# Patient Record
Sex: Female | Born: 1990 | Race: White | Hispanic: No | Smoking: Never smoker
Health system: Southern US, Community
[De-identification: ages and names within clinical notes are randomized; demographics above are authoritative.]

## PROBLEM LIST (undated history)

## (undated) DIAGNOSIS — F32A Depression, unspecified: Secondary | ICD-10-CM

## (undated) DIAGNOSIS — N946 Dysmenorrhea, unspecified: Secondary | ICD-10-CM

## (undated) DIAGNOSIS — F329 Major depressive disorder, single episode, unspecified: Secondary | ICD-10-CM

## (undated) DIAGNOSIS — E559 Vitamin D deficiency, unspecified: Secondary | ICD-10-CM

## (undated) DIAGNOSIS — F419 Anxiety disorder, unspecified: Secondary | ICD-10-CM

## (undated) HISTORY — DX: Dysmenorrhea, unspecified: N94.6

## (undated) HISTORY — DX: Vitamin D deficiency, unspecified: E55.9

---
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
## 2015-02-12 ENCOUNTER — Encounter: Payer: Self-pay | Admitting: *Deleted

## 2015-02-13 ENCOUNTER — Ambulatory Visit (INDEPENDENT_AMBULATORY_CARE_PROVIDER_SITE_OTHER): Payer: Medicaid Other | Admitting: Obstetrics and Gynecology

## 2015-02-13 ENCOUNTER — Encounter: Payer: Self-pay | Admitting: Obstetrics and Gynecology

## 2015-02-13 VITALS — BP 132/90 | HR 98 | Ht 67.0 in | Wt 246.6 lb

## 2015-02-13 DIAGNOSIS — Z91011 Allergy to milk products, unspecified: Secondary | ICD-10-CM | POA: Insufficient documentation

## 2015-02-13 DIAGNOSIS — E669 Obesity, unspecified: Secondary | ICD-10-CM | POA: Insufficient documentation

## 2015-02-13 DIAGNOSIS — Z30432 Encounter for removal of intrauterine contraceptive device: Secondary | ICD-10-CM

## 2015-02-13 NOTE — Progress Notes (Signed)
Patient ID: Francine GravenLillian Havey, female   DOB: 03/11/1991, 24 y.o.   MRN: [id number]  HPI- desires removal of Mirena due to acne, itching in inner thighs and lower abdomen since insertion, and lower pelvic cramping. Not sexually active now.  Francine GravenLillian Agostino is a 24 y.o. year old 440P0000 Caucasian female who presents for removal of a Mirena IUD. Her Mirena IUD was placed 2015.   No LMP recorded. Patient is not currently having periods (Reason: IUD). BP 132/90 mmHg  Pulse 98  Ht 5\' 7"  (1.702 m)  Wt 246 lb 9.6 oz (111.857 kg)  BMI 38.61 kg/m2  Time out was performed.  A pederson speculum was placed in the vagina.  The cervix was visualized, and the strings were not visible. They were grasped inside the cervix and the Mirena was easily removed intact without complications.   F/U as needed  Jolina Symonds Elissa LovettN Burr, CNM

## 2016-06-03 ENCOUNTER — Encounter: Payer: Self-pay | Admitting: Obstetrics and Gynecology

## 2016-06-03 ENCOUNTER — Ambulatory Visit (INDEPENDENT_AMBULATORY_CARE_PROVIDER_SITE_OTHER): Payer: Self-pay | Admitting: Obstetrics and Gynecology

## 2016-06-03 VITALS — BP 122/90 | HR 98 | Ht 67.0 in | Wt 244.4 lb

## 2016-06-03 DIAGNOSIS — R5383 Other fatigue: Secondary | ICD-10-CM

## 2016-06-03 DIAGNOSIS — N644 Mastodynia: Secondary | ICD-10-CM

## 2016-06-03 DIAGNOSIS — N920 Excessive and frequent menstruation with regular cycle: Secondary | ICD-10-CM

## 2016-06-03 NOTE — Progress Notes (Signed)
Subjective:     Patient ID: Francine GravenLillian Cafaro, female   DOB: Feb 28, 1991, 26 y.o.   MRN: [id number]  HPI Menses coming at regular times, but some are really light and other heavier, this month had light menses x7 days, then nothing for one day, and next day (Feb 8th)  Had heavy bleeding with clots and tissue passing, stopped following day. Moderate cramping. Worries than she had a miscarriage. She and female partner not using BC as he has low sperm count. Also reports moderate breast tenderness bilaterally (new) and fatigue, abdominal bloating and increased gas.  Review of Systems Negative except stated in HPI    Objective:   Physical Exam A&O x4 Well groomed female in no distress Blood pressure 122/90, pulse 98, height 5\' 7"  (1.702 m), weight 244 lb 6.4 oz (110.9 kg), last menstrual period 05/24/2016. Breasts: breasts appear normal, no suspicious masses, no skin or nipple changes or axillary nodes. Pelvic exam: normal external genitalia, vulva, vagina, cervix, uterus and adnexa.    Assessment:     Irregular menses Breast pain Fatigue Obesity      Plan:     Labs obtained, will follow up accordingly. Discussed common causes outside of pregnancy of stated symptoms. Reassured of normal findings today. RTC in 1 month for AE as it is past due.  Krystall Kruckenberg [place], CNM

## 2016-06-04 LAB — COMPREHENSIVE METABOLIC PANEL
A/G RATIO: 1.7 (ref 1.2–2.2)
ALT: 13 IU/L (ref 0–32)
AST: 13 IU/L (ref 0–40)
Albumin: 4.5 g/dL (ref 3.5–5.5)
Alkaline Phosphatase: 81 IU/L (ref 39–117)
BILIRUBIN TOTAL: 0.3 mg/dL (ref 0.0–1.2)
BUN/Creatinine Ratio: 10 (ref 9–23)
BUN: 8 mg/dL (ref 6–20)
CHLORIDE: 101 mmol/L (ref 96–106)
CO2: 22 mmol/L (ref 18–29)
Calcium: 9.5 mg/dL (ref 8.7–10.2)
Creatinine, Ser: 0.79 mg/dL (ref 0.57–1.00)
GFR calc Af Amer: 121 mL/min/{1.73_m2} (ref >59)
GFR calc non Af Amer: 105 mL/min/{1.73_m2} (ref >59)
Globulin, Total: 2.6 g/dL (ref 1.5–4.5)
Glucose: 106 mg/dL — ABNORMAL HIGH (ref 65–99)
POTASSIUM: 3.9 mmol/L (ref 3.5–5.2)
Sodium: 140 mmol/L (ref 134–144)
Total Protein: 7.1 g/dL (ref 6.0–8.5)

## 2016-06-04 LAB — HEMOGLOBIN A1C
ESTIMATED AVERAGE GLUCOSE: 97 mg/dL
Hgb A1c MFr Bld: 5 % (ref 4.8–5.6)

## 2016-06-04 LAB — CBC
Hematocrit: 39.8 % (ref 34.0–46.6)
Hemoglobin: 13.8 g/dL (ref 11.1–15.9)
MCH: 31.2 pg (ref 26.6–33.0)
MCHC: 34.7 g/dL (ref 31.5–35.7)
MCV: 90 fL (ref 79–97)
PLATELETS: 372 10*3/uL (ref 150–379)
RBC: 4.42 x10E6/uL (ref 3.77–5.28)
RDW: 12.8 % (ref 12.3–15.4)
WBC: 10.2 10*3/uL (ref 3.4–10.8)

## 2016-06-04 LAB — VITAMIN B12: Vitamin B-12: 531 pg/mL (ref 232–1245)

## 2016-06-04 LAB — THYROID PANEL WITH TSH
Free Thyroxine Index: 2.3 (ref 1.2–4.9)
T3 UPTAKE RATIO: 26 % (ref 24–39)
T4 TOTAL: 8.8 ug/dL (ref 4.5–12.0)
TSH: 2.22 u[IU]/mL (ref 0.450–4.500)

## 2016-06-04 LAB — VITAMIN D 25 HYDROXY (VIT D DEFICIENCY, FRACTURES): Vit D, 25-Hydroxy: 36.1 ng/mL (ref 30.0–100.0)

## 2016-06-04 LAB — BETA HCG QUANT (REF LAB)

## 2016-06-04 LAB — FERRITIN: FERRITIN: 40 ng/mL (ref 15–150)

## 2016-07-06 ENCOUNTER — Encounter: Payer: Medicaid Other | Admitting: Obstetrics and Gynecology

## 2016-09-14 ENCOUNTER — Encounter: Payer: Self-pay | Admitting: Obstetrics and Gynecology

## 2016-09-14 ENCOUNTER — Other Ambulatory Visit: Payer: Self-pay | Admitting: Obstetrics and Gynecology

## 2016-09-14 ENCOUNTER — Ambulatory Visit (INDEPENDENT_AMBULATORY_CARE_PROVIDER_SITE_OTHER): Payer: Medicaid Other | Admitting: Obstetrics and Gynecology

## 2016-09-14 VITALS — BP 131/91 | HR 53 | Ht 67.0 in | Wt 247.3 lb

## 2016-09-14 DIAGNOSIS — N939 Abnormal uterine and vaginal bleeding, unspecified: Secondary | ICD-10-CM

## 2016-09-14 DIAGNOSIS — Z01419 Encounter for gynecological examination (general) (routine) without abnormal findings: Secondary | ICD-10-CM

## 2016-09-14 DIAGNOSIS — Z0001 Encounter for general adult medical examination with abnormal findings: Secondary | ICD-10-CM | POA: Diagnosis not present

## 2016-09-14 NOTE — Patient Instructions (Signed)
 Preventive Care 18-39 Years, Female Preventive care refers to lifestyle choices and visits with your health care provider that can promote health and wellness. What does preventive care include?  A yearly physical exam. This is also called an annual well check.  Dental exams once or twice a year.  Routine eye exams. Ask your health care provider how often you should have your eyes checked.  Personal lifestyle choices, including:  Daily care of your teeth and gums.  Regular physical activity.  Eating a healthy diet.  Avoiding tobacco and drug use.  Limiting alcohol use.  Practicing safe sex.  Taking vitamin and mineral supplements as recommended by your health care provider. What happens during an annual well check? The services and screenings done by your health care provider during your annual well check will depend on your age, overall health, lifestyle risk factors, and family history of disease. Counseling  Your health care provider may ask you questions about your:  Alcohol use.  Tobacco use.  Drug use.  Emotional well-being.  Home and relationship well-being.  Sexual activity.  Eating habits.  Work and work environment.  Method of birth control.  Menstrual cycle.  Pregnancy history. Screening  You may have the following tests or measurements:  Height, weight, and BMI.  Diabetes screening. This is done by checking your blood sugar (glucose) after you have not eaten for a while (fasting).  Blood pressure.  Lipid and cholesterol levels. These may be checked every 5 years starting at age 20.  Skin check.  Hepatitis C blood test.  Hepatitis B blood test.  Sexually transmitted disease (STD) testing.  BRCA-related cancer screening. This may be done if you have a family history of breast, ovarian, tubal, or peritoneal cancers.  Pelvic exam and Pap test. This may be done every 3 years starting at age 21. Starting at age 30, this may be done  every 5 years if you have a Pap test in combination with an HPV test. Discuss your test results, treatment options, and if necessary, the need for more tests with your health care provider. Vaccines  Your health care provider may recommend certain vaccines, such as:  Influenza vaccine. This is recommended every year.  Tetanus, diphtheria, and acellular pertussis (Tdap, Td) vaccine. You may need a Td booster every 10 years.  Varicella vaccine. You may need this if you have not been vaccinated.  HPV vaccine. If you are 26 or younger, you may need three doses over 6 months.  Measles, mumps, and rubella (MMR) vaccine. You may need at least one dose of MMR. You may also need a second dose.  Pneumococcal 13-valent conjugate (PCV13) vaccine. You may need this if you have certain conditions and were not previously vaccinated.  Pneumococcal polysaccharide (PPSV23) vaccine. You may need one or two doses if you smoke cigarettes or if you have certain conditions.  Meningococcal vaccine. One dose is recommended if you are age 19-21 years and a first-year college student living in a residence hall, or if you have one of several medical conditions. You may also need additional booster doses.  Hepatitis A vaccine. You may need this if you have certain conditions or if you travel or work in places where you may be exposed to hepatitis A.  Hepatitis B vaccine. You may need this if you have certain conditions or if you travel or work in places where you may be exposed to hepatitis B.  Haemophilus influenzae type b (Hib) vaccine. You may need   this if you have certain risk factors. Talk to your health care provider about which screenings and vaccines you need and how often you need them. This information is not intended to replace advice given to you by your health care provider. Make sure you discuss any questions you have with your health care provider. Document Released: 05/31/2001 Document Revised:  12/23/2015 Document Reviewed: 02/03/2015 Elsevier Interactive Patient Education  2017 [employer]  

## 2016-09-14 NOTE — Progress Notes (Signed)
   Subjective:     Isabella Weaver is a 26 y.o. female and is here for a comprehensive physical exam. The patient reports problems - menses are watery light red blood in consistency, occuring monthly, 4-5 days in length. current menses heavier and very painful, can't sleep...  Social History   Social History  . Marital status: Single    Spouse name: N/A  . Number of children: N/A  . Years of education: N/A   Occupational History  . Not on file.   Social History Main Topics  . Smoking status: Never Smoker  . Smokeless tobacco: Never Used  . Alcohol use Yes     Comment: occas  . Drug use: No  . Sexual activity: Yes   Other Topics Concern  . Not on file   Social History Narrative  . No narrative on file   Health Maintenance  Topic Date Due  . HIV Screening  06/04/2007  . TETANUS/TDAP  06/04/2011  . PAP SMEAR  06/03/2013  . INFLUENZA VACCINE  11/16/2016    The following portions of the patient's history were reviewed and updated as appropriate: allergies, current medications, past family history, past medical history, past social history, past surgical history and problem list.  Review of Systems Pertinent items noted in HPI and remainder of comprehensive ROS otherwise negative.   Objective:    General appearance: alert, cooperative, appears stated age and moderately obese Neck: no adenopathy, no carotid bruit, no JVD, supple, symmetrical, trachea midline and thyroid not enlarged, symmetric, no tenderness/mass/nodules Lungs: clear to auscultation bilaterally Breasts: normal appearance, no masses or tenderness Heart: regular rate and rhythm, S1, S2 normal, no murmur, click, rub or gallop Abdomen: soft, non-tender; bowel sounds normal; no masses,  no organomegaly Pelvic: cervix normal in appearance, external genitalia normal, no adnexal masses or tenderness, no cervical motion tenderness, rectovaginal septum normal, uterus normal size, shape, and consistency and vagina  normal without discharge    Assessment:    Healthy female exam. Obesity. Abnormal menstrual bleeding with regular cycles, dysmenorrhea     Plan:  Pap obtained. Will return in 4-6 weeks for pelvic ultrasound and will follow up accordingly. Otherwise RTC 1 year  Melody Ponchat[place]amble[place], [place]M                      See After Visit Summary for Counseling Recommendations

## 2016-09-21 LAB — CYTOLOGY - PAP

## 2016-10-12 ENCOUNTER — Ambulatory Visit (INDEPENDENT_AMBULATORY_CARE_PROVIDER_SITE_OTHER): Payer: Self-pay

## 2016-10-12 DIAGNOSIS — N939 Abnormal uterine and vaginal bleeding, unspecified: Secondary | ICD-10-CM

## 2017-09-19 ENCOUNTER — Encounter: Payer: Medicaid Other | Admitting: Obstetrics and Gynecology

## 2018-04-18 NOTE — L&D Delivery Note (Signed)
Delivery Note At  0905 am a viable and healthy female ""Jettie Booze""" was delivered via  (Presentation:ROA ;  ) after a precipitous delivery.  APGAR: 8,9 ; weight pending skin to skin  .   Placenta status:delivered intact with 3 vessel  Cord:  with the following complications: loose should cord easily reduced  Anesthesia:  local Episiotomy:  none Lacerations:  2nd degree and right labial (both repairs) Suture Repair: 3.0 vicryl rapide Est. Blood Loss (mL):  500  Mom to postpartum.  Baby to Couplet care / Skin to Skin.  Melody N Shambley 01/23/2019, 9:51 AM

## 2018-06-07 ENCOUNTER — Ambulatory Visit: Payer: BLUE CROSS/BLUE SHIELD | Admitting: Obstetrics and Gynecology

## 2018-06-07 ENCOUNTER — Encounter: Payer: Self-pay | Admitting: Obstetrics and Gynecology

## 2018-06-07 VITALS — BP 119/59 | HR 88 | Ht 67.0 in | Wt 200.0 lb

## 2018-06-07 DIAGNOSIS — N926 Irregular menstruation, unspecified: Secondary | ICD-10-CM

## 2018-06-07 DIAGNOSIS — Z3201 Encounter for pregnancy test, result positive: Secondary | ICD-10-CM

## 2018-06-07 LAB — POCT URINE PREGNANCY: Preg Test, Ur: POSITIVE — AB

## 2018-06-07 NOTE — Patient Instructions (Signed)
First Trimester of Pregnancy The first trimester of pregnancy is from week 1 until the end of week 13 (months 1 through 3). A week after a sperm fertilizes an egg, the egg will implant on the wall of the uterus. This embryo will begin to develop into a baby. Genes from you and your partner will form the baby. The female genes will determine whether the baby will be a boy or a girl. At 6-8 weeks, the eyes and face will be formed, and the heartbeat can be seen on ultrasound. At the end of 12 weeks, all the baby's organs will be formed. Now that you are pregnant, you will want to do everything you can to have a healthy baby. Two of the most important things are to get good prenatal care and to follow your health care provider's instructions. Prenatal care is all the medical care you receive before the baby's birth. This care will help prevent, find, and treat any problems during the pregnancy and childbirth. Body changes during your first trimester Your body goes through many changes during pregnancy. The changes vary from woman to woman.  You may gain or lose a couple of pounds at first.  You may feel sick to your stomach (nauseous) and you may throw up (vomit). If the vomiting is uncontrollable, call your health care provider.  You may tire easily.  You may develop headaches that can be relieved by medicines. All medicines should be approved by your health care provider.  You may urinate more often. Painful urination may mean you have a bladder infection.  You may develop heartburn as a result of your pregnancy.  You may develop constipation because certain hormones are causing the muscles that push stool through your intestines to slow down.  You may develop hemorrhoids or swollen veins (varicose veins).  Your breasts may begin to grow larger and become tender. Your nipples may stick out more, and the tissue that surrounds them (areola) may become darker.  Your gums may bleed and may be  sensitive to brushing and flossing.  Dark spots or blotches (chloasma, mask of pregnancy) may develop on your face. This will likely fade after the baby is born.  Your menstrual periods will stop.  You may have a loss of appetite.  You may develop cravings for certain kinds of food.  You may have changes in your emotions from day to day, such as being excited to be pregnant or being concerned that something may go wrong with the pregnancy and baby.  You may have more vivid and strange dreams.  You may have changes in your hair. These can include thickening of your hair, rapid growth, and changes in texture. Some women also have hair loss during or after pregnancy, or hair that feels dry or thin. Your hair will most likely return to normal after your baby is born. What to expect at prenatal visits During a routine prenatal visit:  You will be weighed to make sure you and the baby are growing normally.  Your blood pressure will be taken.  Your abdomen will be measured to track your baby's growth.  The fetal heartbeat will be listened to between weeks 10 and 14 of your pregnancy.  Test results from any previous visits will be discussed. Your health care provider may ask you:  How you are feeling.  If you are feeling the baby move.  If you have had any abnormal symptoms, such as leaking fluid, bleeding, severe headaches, or abdominal  cramping.  If you are using any tobacco products, including cigarettes, chewing tobacco, and electronic cigarettes.  If you have any questions. Other tests that may be performed during your first trimester include:  Blood tests to find your blood type and to check for the presence of any previous infections. The tests will also be used to check for low iron levels (anemia) and protein on red blood cells (Rh antibodies). Depending on your risk factors, or if you previously had diabetes during pregnancy, you may have tests to check for high blood sugar  that affects pregnant women (gestational diabetes).  Urine tests to check for infections, diabetes, or protein in the urine.  An ultrasound to confirm the proper growth and development of the baby.  Fetal screens for spinal cord problems (spina bifida) and Down syndrome.  HIV (human immunodeficiency virus) testing. Routine prenatal testing includes screening for HIV, unless you choose not to have this test.  You may need other tests to make sure you and the baby are doing well. Follow these instructions at home: Medicines  Follow your health care provider's instructions regarding medicine use. Specific medicines may be either safe or unsafe to take during pregnancy.  Take a prenatal vitamin that contains at least 600 micrograms (mcg) of folic acid.  If you develop constipation, try taking a stool softener if your health care provider approves. Eating and drinking   Eat a balanced diet that includes fresh fruits and vegetables, whole grains, good sources of protein such as meat, eggs, or tofu, and low-fat dairy. Your health care provider will help you determine the amount of weight gain that is right for you.  Avoid raw meat and uncooked cheese. These carry germs that can cause birth defects in the baby.  Eating four or five small meals rather than three large meals a day may help relieve nausea and vomiting. If you start to feel nauseous, eating a few soda crackers can be helpful. Drinking liquids between meals, instead of during meals, also seems to help ease nausea and vomiting.  Limit foods that are high in fat and processed sugars, such as fried and sweet foods.  To prevent constipation: ? Eat foods that are high in fiber, such as fresh fruits and vegetables, whole grains, and beans. ? Drink enough fluid to keep your urine clear or pale yellow. Activity  Exercise only as directed by your health care provider. Most women can continue their usual exercise routine during  pregnancy. Try to exercise for 30 minutes at least 5 days a week. Exercising will help you: ? Control your weight. ? Stay in shape. ? Be prepared for labor and delivery.  Experiencing pain or cramping in the lower abdomen or lower back is a good sign that you should stop exercising. Check with your health care provider before continuing with normal exercises.  Try to avoid standing for long periods of time. Move your legs often if you must stand in one place for a long time.  Avoid heavy lifting.  Wear low-heeled shoes and practice good posture.  You may continue to have sex unless your health care provider tells you not to. Relieving pain and discomfort  Wear a good support bra to relieve breast tenderness.  Take warm sitz baths to soothe any pain or discomfort caused by hemorrhoids. Use hemorrhoid cream if your health care provider approves.  Rest with your legs elevated if you have leg cramps or low back pain.  If you develop varicose veins in  your legs, wear support hose. Elevate your feet for 15 minutes, 3-4 times a day. Limit salt in your diet. Prenatal care  Schedule your prenatal visits by the twelfth week of pregnancy. They are usually scheduled monthly at first, then more often in the last 2 months before delivery.  Write down your questions. Take them to your prenatal visits.  Keep all your prenatal visits as told by your health care provider. This is important. Safety  Wear your seat belt at all times when driving.  Make a list of emergency phone numbers, including numbers for family, friends, the hospital, and police and fire departments. General instructions  Ask your health care provider for a referral to a local prenatal education class. Begin classes no later than the beginning of month 6 of your pregnancy.  Ask for help if you have counseling or nutritional needs during pregnancy. Your health care provider can offer advice or refer you to specialists for help  with various needs.  Do not use hot tubs, steam rooms, or saunas.  Do not douche or use tampons or scented sanitary pads.  Do not cross your legs for long periods of time.  Avoid cat litter boxes and soil used by cats. These carry germs that can cause birth defects in the baby and possibly loss of the fetus by miscarriage or stillbirth.  Avoid all smoking, herbs, alcohol, and medicines not prescribed by your health care provider. Chemicals in these products affect the formation and growth of the baby.  Do not use any products that contain nicotine or tobacco, such as cigarettes and e-cigarettes. If you need help quitting, ask your health care provider. You may receive counseling support and other resources to help you quit.  Schedule a dentist appointment. At home, brush your teeth with a soft toothbrush and be gentle when you floss. Contact a health care provider if:  You have dizziness.  You have mild pelvic cramps, pelvic pressure, or nagging pain in the abdominal area.  You have persistent nausea, vomiting, or diarrhea.  You have a bad smelling vaginal discharge.  You have pain when you urinate.  You notice increased swelling in your face, hands, legs, or ankles.  You are exposed to fifth disease or chickenpox.  You are exposed to [place] measles (rubella) and have never had it. Get help right away if:  You have a fever.  You are leaking fluid from your vagina.  You have spotting or bleeding from your vagina.  You have severe abdominal cramping or pain.  You have rapid weight gain or loss.  You vomit blood or material that looks like coffee grounds.  You develop a severe headache.  You have shortness of breath.  You have any kind of trauma, such as from a fall or a car accident. Summary  The first trimester of pregnancy is from week 1 until the end of week 13 (months 1 through 3).  Your body goes through many changes during pregnancy. The changes vary from  woman to woman.  You will have routine prenatal visits. During those visits, your health care provider will examine you, discuss any test results you may have, and talk with you about how you are feeling. This information is not intended to replace advice given to you by your health care provider. Make sure you discuss any questions you have with your health care provider. Document Released: 03/29/2001 Document Revised: 03/16/2016 Document Reviewed: 03/16/2016 Elsevier Interactive Patient Education  2019 [employer]. Common Medications  Safe in Pregnancy  Acne:      Constipation:  Benzoyl Peroxide     Colace  Clindamycin      Dulcolax Suppository  Topica Erythromycin     Fibercon  Salicylic Acid      Metamucil         Miralax AVOID:        Senakot   Accutane    Cough:  Retin-A       Cough Drops  Tetracycline      Phenergan w/ Codeine if Rx  Minocycline      Robitussin (Plain & DM)  Antibiotics:     Crabs/Lice:  Ceclor       RID  Cephalosporins    AVOID:  E-Mycins      Kwell  Keflex  Macrobid/Macrodantin   Diarrhea:  Penicillin      Kao-Pectate  Zithromax      Imodium AD         PUSH FLUIDS AVOID:       Cipro     Fever:  Tetracycline      Tylenol (Regular or Extra  Minocycline       Strength)  Levaquin      Extra Strength-Do not          Exceed 8 tabs/24 hrs Caffeine:        '200mg'$ /day (equiv. To 1 cup of coffee or  approx. 3 12 oz sodas)         Gas: Cold/Hayfever:       Gas-X  Benadryl      Mylicon  Claritin       Phazyme  **Claritin-D        Chlor-Trimeton    Headaches:  Dimetapp      ASA-Free Excedrin  Drixoral-Non-Drowsy     Cold Compress  Mucinex (Guaifenasin)     Tylenol (Regular or Extra  Sudafed/Sudafed-12 Hour     Strength)  **Sudafed PE Pseudoephedrine   Tylenol Cold & Sinus     Vicks Vapor Rub  Zyrtec  **AVOID if Problems With Blood Pressure         Heartburn: Avoid lying down for at least 1 hour after  meals  Aciphex      Maalox     Rash:  Milk of Magnesia     Benadryl    Mylanta       1% Hydrocortisone Cream  Pepcid  Pepcid Complete   Sleep Aids:  Prevacid      Ambien   Prilosec       Benadryl  Rolaids       Chamomile Tea  Tums (Limit 4/day)     Unisom  Zantac       Tylenol PM         Warm milk-add vanilla or  Hemorrhoids:       Sugar for taste  Anusol/Anusol H.C.  (RX: Analapram 2.5%)  Sugar Substitutes:  Hydrocortisone OTC     Ok in moderation  Preparation H      Tucks        Vaseline lotion applied to tissue with wiping    Herpes:     Throat:  Acyclovir      Oragel  Famvir  Valtrex     Vaccines:         Flu Shot Leg Cramps:       *Gardasil  Benadryl      Hepatitis A         Hepatitis B Nasal Spray:  Pneumovax  Saline Nasal Spray     Polio Booster         Tetanus Nausea:       Tuberculosis test or PPD  Vitamin B6 25 mg TID   AVOID:    Dramamine      *Gardasil  Emetrol       Live Poliovirus  Ginger Root 250 mg QID    MMR (measles, mumps &  High Complex Carbs @ Bedtime    rebella)  Sea Bands-Accupressure    Varicella (Chickenpox)  Unisom 1/2 tab TID     *No known complications           If received before Pain:         Known pregnancy;   Darvocet       Resume series after  Lortab        Delivery  Percocet    Yeast:   Tramadol      Femstat  Tylenol 3      Gyne-lotrimin  Ultram       Monistat  Vicodin           MISC:         All Sunscreens           Hair Coloring/highlights          Insect Repellant's          (Including DEET)         Mystic Tans Nausea, Adult Nausea is the feeling that you have an upset stomach or that you are about to vomit. Nausea on its own is not usually a serious concern, but it may be an early sign of a more serious medical problem. As nausea gets worse, it can lead to vomiting. If vomiting develops, or if you are not able to drink enough fluids, you are at risk of becoming dehydrated. Dehydration can make you tired and thirsty,  cause you to have a dry mouth, and decrease how often you urinate. Older adults and people with other diseases or a weak disease-fighting system (immune system) are at higher risk for dehydration. The main goals of treating your nausea are:  To relieve your nausea.  To limit repeated nausea episodes.  To prevent vomiting and dehydration. Follow these instructions at home: Watch your symptoms for any changes. Tell your health care provider about them. Follow these instructions as told by your health care provider. Eating and drinking      Take an oral rehydration solution (ORS). This is a drink that is sold at pharmacies and retail stores.  Drink clear fluids slowly and in small amounts as you are able. Clear fluids include water, ice chips, low-calorie sports drinks, and fruit juice that has water added (diluted fruit juice).  Eat bland, easy-to-digest foods in small amounts as you are able. These foods include bananas, applesauce, rice, lean meats, toast, and crackers.  Avoid drinking fluids that contain a lot of sugar or caffeine, such as energy drinks, sports drinks, and soda.  Avoid alcohol.  Avoid spicy or fatty foods. General instructions  Take over-the-counter and prescription medicines only as told by your health care provider.  Rest at home while you recover.  Drink enough fluid to keep your urine pale yellow.  Breathe slowly and deeply when you feel nauseous.  Avoid smelling things that have strong odors.  Wash your hands often using soap and water. If soap and water are not available, use hand sanitizer.  Make sure that all people in  your household wash their hands well and often.  Keep all follow-up visits as told by your health care provider. This is important. Contact a health care provider if:  Your nausea gets worse.  Your nausea does not go away after two days.  You vomit.  You cannot drink fluids without vomiting.  You have any of the  following: ? New symptoms. ? A fever. ? A headache. ? Muscle cramps. ? A rash. ? Pain while urinating.  You feel light-headed or dizzy. Get help right away if:  You have pain in your chest, neck, arm, or jaw.  You feel extremely weak or you faint.  You have vomit that is bright red or looks like coffee grounds.  You have bloody or black stools or stools that look like tar.  You have a severe headache, a stiff neck, or both.  You have severe pain, cramping, or bloating in your abdomen.  You have difficulty breathing or are breathing very quickly.  Your heart is beating very quickly.  Your skin feels cold and clammy.  You feel confused.  You have signs of dehydration, such as: ? Dark urine, very little urine, or no urine. ? Cracked lips. ? Dry mouth. ? Sunken eyes. ? Sleepiness. ? Weakness. These symptoms may represent a serious problem that is an emergency. Do not wait to see if the symptoms will go away. Get medical help right away. Call your local emergency services (911 in the [place] Do not drive yourself to the hospital. Summary  Nausea is the feeling that you have an upset stomach or that you are about to vomit. Nausea on its own is not usually a serious concern, but it may be an early sign of a more serious medical problem.  If vomiting develops, or if you are not able to drink enough fluids, you are at risk of becoming dehydrated.  Follow recommendations for eating and drinking and take over-the-counter and prescription medicines only as told by your health care provider.  Contact a health care provider right away if your symptoms worsen or you have new symptoms.  Keep all follow-up visits as told by your health care provider. This is important. This information is not intended to replace advice given to you by your health care provider. Make sure you discuss any questions you have with your health care provider. Document Released: 05/12/2004 Document Revised:  09/12/2017 Document Reviewed: 09/12/2017 Elsevier Interactive Patient Education  2019 [employer].

## 2018-06-07 NOTE — Progress Notes (Signed)
  Subjective:     Patient ID: Isabella Weaver, female   DOB: [birth year], 28 y.o.   MRN: [id number]  HPI Here for pregnancy confirmation, reports normal LMP 04/24/2018, giving EDC 01/29/2019 and EGA [redacted]w[redacted]d. Reports breast tenderness, headaches and mild nausea. Reports UPT+ 2 weeks ago.   Review of Systems  Constitutional: Positive for fatigue.  Neurological: Positive for headaches.  All other systems reviewed and are negative.      Objective:   Physical Exam A&Ox4 Well groomed female in no distress Blood pressure (!) 119/59, pulse 88, height 5\' 7"  (1.702 m), weight 200 lb (90.7 kg), last menstrual period 04/24/2018. Body mass index is 31.32 kg/m.  UPT+    Assessment:     Missed menses BMI 31     Plan:     Will plan viability scan in 2 weeks with nurse visit and labs. Then New OB PE with me in 5 weeks. Congratulated on pregnancy.    Kimani Bedoya,CNM

## 2018-06-19 ENCOUNTER — Other Ambulatory Visit: Payer: Self-pay | Admitting: Obstetrics and Gynecology

## 2018-06-19 DIAGNOSIS — N926 Irregular menstruation, unspecified: Secondary | ICD-10-CM

## 2018-06-19 DIAGNOSIS — Z3689 Encounter for other specified antenatal screening: Secondary | ICD-10-CM

## 2018-06-20 ENCOUNTER — Other Ambulatory Visit: Payer: BLUE CROSS/BLUE SHIELD

## 2018-06-20 ENCOUNTER — Other Ambulatory Visit: Payer: Self-pay

## 2018-06-20 ENCOUNTER — Ambulatory Visit
Admission: RE | Admit: 2018-06-20 | Discharge: 2018-06-20 | Disposition: A | Discharge status: Home / Self Care | Payer: BLUE CROSS/BLUE SHIELD | Source: Ambulatory Visit | Attending: Obstetrics and Gynecology | Admitting: Obstetrics and Gynecology

## 2018-06-20 DIAGNOSIS — N926 Irregular menstruation, unspecified: Secondary | ICD-10-CM | POA: Diagnosis not present

## 2018-06-20 DIAGNOSIS — Z3689 Encounter for other specified antenatal screening: Secondary | ICD-10-CM | POA: Insufficient documentation

## 2018-06-21 ENCOUNTER — Telehealth: Payer: Self-pay

## 2018-06-21 ENCOUNTER — Ambulatory Visit: Payer: BLUE CROSS/BLUE SHIELD

## 2018-06-21 VITALS — BP 112/73 | HR 83 | Ht 67.0 in | Wt 200.6 lb

## 2018-06-21 DIAGNOSIS — R638 Other symptoms and signs concerning food and fluid intake: Secondary | ICD-10-CM

## 2018-06-21 DIAGNOSIS — Z3401 Encounter for supervision of normal first pregnancy, first trimester: Secondary | ICD-10-CM

## 2018-06-21 DIAGNOSIS — Z0283 Encounter for blood-alcohol and blood-drug test: Secondary | ICD-10-CM

## 2018-06-21 DIAGNOSIS — Z113 Encounter for screening for infections with a predominantly sexual mode of transmission: Secondary | ICD-10-CM

## 2018-06-21 NOTE — Patient Instructions (Signed)
WHAT OB PATIENTS CAN EXPECT   Confirmation of pregnancy and ultrasound ordered if medically indicated-[redacted] weeks gestation  New OB (NOB) intake with nurse and New OB (NOB) labs- [redacted] weeks gestation  New OB (NOB) physical examination with provider- 11/[redacted] weeks gestation  Flu vaccine-[redacted] weeks gestation  Anatomy scan-[redacted] weeks gestation  Glucose tolerance test, blood work to test for anemia, T-dap vaccine-[redacted] weeks gestation  Vaginal swabs/cultures-STD/Group B strep-[redacted] weeks gestation  Appointments every 4 weeks until 28 weeks  Every 2 weeks from 28 weeks until 36 weeks  Weekly visits from 36 weeks until delivery  Morning Sickness  Morning sickness is when you feel sick to your stomach (nauseous) during pregnancy. You may feel sick to your stomach and throw up (vomit). You may feel sick in the morning, but you can feel this way at any time of day. Some women feel very sick to their stomach and cannot stop throwing up (hyperemesis gravidarum). Follow these instructions at home: Medicines  Take over-the-counter and prescription medicines only as told by your doctor. Do not take any medicines until you talk with your doctor about them first.  Taking multivitamins before getting pregnant can stop or lessen the harshness of morning sickness. Eating and drinking  Eat dry toast or crackers before getting out of bed.  Eat 5 or 6 small meals a day.  Eat dry and bland foods like rice and baked potatoes.  Do not eat greasy, fatty, or spicy foods.  Have someone cook for you if the smell of food causes you to feel sick or throw up.  If you feel sick to your stomach after taking prenatal vitamins, take them at night or with a snack.  Eat protein when you need a snack. Nuts, yogurt, and cheese are good choices.  Drink fluids throughout the day.  Try ginger ale made with real ginger, ginger tea made from fresh grated ginger, or ginger candies. General instructions  Do not use any products  that have nicotine or tobacco in them, such as cigarettes and e-cigarettes. If you need help quitting, ask your doctor.  Use an air purifier to keep the air in your house free of smells.  Get lots of fresh air.  Try to avoid smells that make you feel sick.  Try: ? Wearing a bracelet that is used for seasickness (acupressure wristband). ? Going to a doctor who puts thin needles into certain body points (acupuncture) to improve how you feel. Contact a doctor if:  You need medicine to feel better.  You feel dizzy or light-headed.  You are losing weight. Get help right away if:  You feel very sick to your stomach and cannot stop throwing up.  You pass out (faint).  You have very bad pain in your belly. Summary  Morning sickness is when you feel sick to your stomach (nauseous) during pregnancy.  You may feel sick in the morning, but you can feel this way at any time of day.  Making some changes to what you eat may help your symptoms go away. This information is not intended to replace advice given to you by your health care provider. Make sure you discuss any questions you have with your health care provider. Document Released: 05/12/2004 Document Revised: 05/05/2016 Document Reviewed: 05/05/2016 Elsevier Interactive Patient Education  2019 [employer]. How a Baby Grows During Pregnancy  Pregnancy begins when a female's sperm enters a female's egg (fertilization). Fertilization usually happens in one of the tubes (fallopian tubes) that connect  the ovaries to the womb (uterus). The fertilized egg moves down the fallopian tube to the uterus. Once it reaches the uterus, it implants into the lining of the uterus and begins to grow. For the first 10 weeks, the fertilized egg is called an embryo. After 10 weeks, it is called a fetus. As the fetus continues to grow, it receives oxygen and nutrients through tissue (placenta) that grows to support the developing baby. The placenta is the life  support system for the baby. It provides oxygen and nutrition and removes waste. Learning as much as you can about your pregnancy and how your baby is developing can help you enjoy the experience. It can also make you aware of when there might be a problem and when to ask questions. How long does a typical pregnancy last? A pregnancy usually lasts 280 days, or about 40 weeks. Pregnancy is divided into three periods of growth, also called trimesters:  First trimester: 0-12 weeks.  Second trimester: 13-27 weeks.  Third trimester: 28-40 weeks. The day when your baby is ready to be born (full term) is your estimated date of delivery. How does my baby develop month by month? First month  The fertilized egg attaches to the inside of the uterus.  Some cells will form the placenta. Others will form the fetus.  The arms, legs, brain, spinal cord, lungs, and heart begin to develop.  At the end of the first month, the heart begins to beat. Second month  The bones, inner ear, eyelids, hands, and feet form.  The genitals develop.  By the end of 8 weeks, all major organs are developing. Third month  All of the internal organs are forming.  Teeth develop below the gums.  Bones and muscles begin to grow. The spine can flex.  The skin is transparent.  Fingernails and toenails begin to form.  Arms and legs continue to grow longer, and hands and feet develop.  The fetus is about 3 inches (7.6 cm) long. Fourth month  The placenta is completely formed.  The external sex organs, neck, outer ear, eyebrows, eyelids, and fingernails are formed.  The fetus can hear, swallow, and move its arms and legs.  The kidneys begin to produce urine.  The skin is covered with a white, waxy coating (vernix) and very fine hair (lanugo). Fifth month  The fetus moves around more and can be felt for the first time (quickening).  The fetus starts to sleep and wake up and may begin to suck its  finger.  The nails grow to the end of the fingers.  The organ in the digestive system that makes bile (gallbladder) functions and helps to digest nutrients.  If your baby is a girl, eggs are present in her ovaries. If your baby is a boy, testicles start to move down into his scrotum. Sixth month  The lungs are formed.  The eyes open. The brain continues to develop.  Your baby has fingerprints and toe prints. Your baby's hair grows thicker.  At the end of the second trimester, the fetus is about 9 inches (22.9 cm) long. Seventh month  The fetus kicks and stretches.  The eyes are developed enough to sense changes in light.  The hands can make a grasping motion.  The fetus responds to sound. Eighth month  All organs and body systems are fully developed and functioning.  Bones harden, and taste buds develop. The fetus may hiccup.  Certain areas of the brain are still developing.  The skull remains soft. Ninth month  The fetus gains about  lb (0.23 kg) each week.  The lungs are fully developed.  Patterns of sleep develop.  The fetus's head typically moves into a head-down position (vertex) in the uterus to prepare for birth.  The fetus weighs 6-9 lb (2.72-4.08 kg) and is 19-20 inches (48.26-50.8 cm) long. What can I do to have a healthy pregnancy and help my baby develop? General instructions  Take prenatal vitamins as directed by your health care provider. These include vitamins such as folic acid, iron, calcium, and vitamin D. They are important for healthy development.  Take medicines only as directed by your health care provider. Read labels and ask a pharmacist or your health care provider whether over-the-counter medicines, supplements, and prescription drugs are safe to take during pregnancy.  Keep all follow-up visits as directed by your health care provider. This is important. Follow-up visits include prenatal care and screening tests. How do I know if my baby  is developing well? At each prenatal visit, your health care provider will do several different tests to check on your health and keep track of your baby's development. These include:  Fundal height and position. ? Your health care provider will measure your growing belly from your pubic bone to the top of the uterus using a tape measure. ? Your health care provider will also feel your belly to determine your baby's position.  Heartbeat. ? An ultrasound in the first trimester can confirm pregnancy and show a heartbeat, depending on how far along you are. ? Your health care provider will check your baby's heart rate at every prenatal visit.  Second trimester ultrasound. ? This ultrasound checks your baby's development. It also may show your baby's gender. What should I do if I have concerns about my baby's development? Always talk with your health care provider about any concerns that you may have about your pregnancy and your baby. Summary  A pregnancy usually lasts 280 days, or about 40 weeks. Pregnancy is divided into three periods of growth, also called trimesters.  Your health care provider will monitor your baby's growth and development throughout your pregnancy.  Follow your health care provider's recommendations about taking prenatal vitamins and medicines during your pregnancy.  Talk with your health care provider if you have any concerns about your pregnancy or your developing baby. This information is not intended to replace advice given to you by your health care provider. Make sure you discuss any questions you have with your health care provider. Document Released: 09/21/2007 Document Revised: 02/15/2017 Document Reviewed: 02/15/2017 Elsevier Interactive Patient Education  2019 [employer] of Pregnancy  The first trimester of pregnancy is from week 1 until the end of week 13 (months 1 through 3). During this time, your baby will begin to develop inside  you. At 6-8 weeks, the eyes and face are formed, and the heartbeat can be seen on ultrasound. At the end of 12 weeks, all the baby's organs are formed. Prenatal care is all the medical care you receive before the birth of your baby. Make sure you get good prenatal care and follow all of your doctor's instructions. Follow these instructions at home: Medicines  Take over-the-counter and prescription medicines only as told by your doctor. Some medicines are safe and some medicines are not safe during pregnancy.  Take a prenatal vitamin that contains at least 600 micrograms (mcg) of folic acid.  If you have trouble pooping (constipation), take  medicine that will make your stool soft (stool softener) if your doctor approves. Eating and drinking   Eat regular, healthy meals.  Your doctor will tell you the amount of weight gain that is right for you.  Avoid raw meat and uncooked cheese.  If you feel sick to your stomach (nauseous) or throw up (vomit): ? Eat 4 or 5 small meals a day instead of 3 large meals. ? Try eating a few soda crackers. ? Drink liquids between meals instead of during meals.  To prevent constipation: ? Eat foods that are high in fiber, like fresh fruits and vegetables, whole grains, and beans. ? Drink enough fluids to keep your pee (urine) clear or pale yellow. Activity  Exercise only as told by your doctor. Stop exercising if you have cramps or pain in your lower belly (abdomen) or low back.  Do not exercise if it is too hot, too humid, or if you are in a place of great height (high altitude).  Try to avoid standing for long periods of time. Move your legs often if you must stand in one place for a long time.  Avoid heavy lifting.  Wear low-heeled shoes. Sit and stand up straight.  You can have sex unless your doctor tells you not to. Relieving pain and discomfort  Wear a good support bra if your breasts are sore.  Take warm water baths (sitz baths) to soothe  pain or discomfort caused by hemorrhoids. Use hemorrhoid cream if your doctor says it is okay.  Rest with your legs raised if you have leg cramps or low back pain.  If you have puffy, bulging veins (varicose veins) in your legs: ? Wear support hose or compression stockings as told by your doctor. ? Raise (elevate) your feet for 15 minutes, 3-4 times a day. ? Limit salt in your food. Prenatal care  Schedule your prenatal visits by the twelfth week of pregnancy.  Write down your questions. Take them to your prenatal visits.  Keep all your prenatal visits as told by your doctor. This is important. Safety  Wear your seat belt at all times when driving.  Make a list of emergency phone numbers. The list should include numbers for family, friends, the hospital, and police and fire departments. General instructions  Ask your doctor for a referral to a local prenatal class. Begin classes no later than at the start of month 6 of your pregnancy.  Ask for help if you need counseling or if you need help with nutrition. Your doctor can give you advice or tell you where to go for help.  Do not use hot tubs, steam rooms, or saunas.  Do not douche or use tampons or scented sanitary pads.  Do not cross your legs for long periods of time.  Avoid all herbs and alcohol. Avoid drugs that are not approved by your doctor.  Do not use any tobacco products, including cigarettes, chewing tobacco, and electronic cigarettes. If you need help quitting, ask your doctor. You may get counseling or other support to help you quit.  Avoid cat litter boxes and soil used by cats. These carry germs that can cause birth defects in the baby and can cause a loss of your baby (miscarriage) or stillbirth.  Visit your dentist. At home, brush your teeth with a soft toothbrush. Be gentle when you floss. Contact a doctor if:  You are dizzy.  You have mild cramps or pressure in your lower belly.  You have a  nagging pain  in your belly area.  You continue to feel sick to your stomach, you throw up, or you have watery poop (diarrhea).  You have a bad smelling fluid coming from your vagina.  You have pain when you pee (urinate).  You have increased puffiness (swelling) in your face, hands, legs, or ankles. Get help right away if:  You have a fever.  You are leaking fluid from your vagina.  You have spotting or bleeding from your vagina.  You have very bad belly cramping or pain.  You gain or lose weight rapidly.  You throw up blood. It may look like coffee grounds.  You are around people who have [place] measles, fifth disease, or chickenpox.  You have a very bad headache.  You have shortness of breath.  You have any kind of trauma, such as from a fall or a car accident. Summary  The first trimester of pregnancy is from week 1 until the end of week 13 (months 1 through 3).  To take care of yourself and your unborn baby, you will need to eat healthy meals, take medicines only if your doctor tells you to do so, and do activities that are safe for you and your baby.  Keep all follow-up visits as told by your doctor. This is important as your doctor will have to ensure that your baby is healthy and growing well. This information is not intended to replace advice given to you by your health care provider. Make sure you discuss any questions you have with your health care provider. Document Released: 09/21/2007 Document Revised: 04/12/2016 Document Reviewed: 04/12/2016 Elsevier Interactive Patient Education  2019 [employer]. Commonly Asked Questions During Pregnancy  Cats: A parasite can be excreted in cat feces.  To avoid exposure you need to have another person empty the little box.  If you must empty the litter box you will need to wear gloves.  Wash your hands after handling your cat.  This parasite can also be found in raw or undercooked meat so this should also be avoided.  Colds, Sore  Throats, Flu: Please check your medication sheet to see what you can take for symptoms.  If your symptoms are unrelieved by these medications please call the office.  Dental Work: Most any dental work Investment banker[employer] corporate recommends is permitted.  X-rays should only be taken during the first trimester if absolutely necessary.  Your abdomen should be shielded with a lead apron during all x-rays.  Please notify your provider prior to receiving any x-rays.  Novocaine is fine; gas is not recommended.  If your dentist requires a note from [place] prior to dental work please call the office and we will provide one for you.  Exercise: Exercise is an important part of staying healthy during your pregnancy.  You may continue most exercises you were accustomed to prior to pregnancy.  Later in your pregnancy you will most likely notice you have difficulty with activities requiring balance like riding a bicycle.  It is important that you listen to your body and avoid activities that put you at a higher risk of falling.  Adequate rest and staying well hydrated are a must!  If you have questions about the safety of specific activities ask your provider.    Exposure to Children with illness: Try to avoid obvious exposure; report any symptoms to [place] when noted,  If you have chicken pos, red measles or mumps, you should be immune to these diseases.  Please do not take any vaccines while pregnant unless you have checked with your OB provider.  Fetal Movement: After 28 weeks we recommend you do "kick counts" twice daily.  Lie or sit down in a calm quiet environment and count your baby movements "kicks".  You should feel your baby at least 10 times per hour.  If you have not felt 10 kicks within the first hour get up, walk around and have something sweet to eat or drink then repeat for an additional hour.  If count remains less than 10 per hour notify your provider.  Fumigating: Follow your pest control agent's advice as to how long to  stay out of your home.  Ventilate the area well before re-entering.  Hemorrhoids:   Most over-the-counter preparations can be used during pregnancy.  Check your medication to see what is safe to use.  It is important to use a stool softener or fiber in your diet and to drink lots of liquids.  If hemorrhoids seem to be getting worse please call the office.   Hot Tubs:  Hot tubs Jacuzzis and saunas are not recommended while pregnant.  These increase your internal body temperature and should be avoided.  Intercourse:  Sexual intercourse is safe during pregnancy as long as you are comfortable, unless otherwise advised by your provider.  Spotting may occur after intercourse; report any bright red bleeding that is heavier than spotting.  Labor:  If you know that you are in labor, please go to the hospital.  If you are unsure, please call the office and let us help you decide what to do.  Lifting, straining, etc:  If your job requires heavy lifting or straining please check with your provider for any limitations.  Generally, you should not lift items heavier than that you can lift simply with your hands and arms (no back muscles)  Painting:  Paint fumes do not harm your pregnancy, but may make you ill and should be avoided if possible.  Latex or water based paints have less odor than oils.  Use adequate ventilation while painting.  Permanents & Hair Color:  Chemicals in hair dyes are not recommended as they cause increase hair dryness which can increase hair loss during pregnancy.  " Highlighting" and permanents are allowed.  Dye may be absorbed differently and permanents may not hold as well during pregnancy.  Sunbathing:  Use a sunscreen, as skin burns easily during pregnancy.  Drink plenty of fluids; avoid over heating.  Tanning Beds:  Because their possible side effects are still unknown, tanning beds are not recommended.  Ultrasound Scans:  Routine ultrasounds are performed at approximately 20  weeks.  You will be able to see your baby's general anatomy an if you would like to know the gender this can usually be determined as well.  If it is questionable when you conceived you may also receive an ultrasound early in your pregnancy for dating purposes.  Otherwise ultrasound exams are not routinely performed unless there is a medical necessity.  Although you can request a scan we ask that you pay for it when conducted because insurance does not cover " patient request" scans.  Work: If your pregnancy proceeds without complications you may work until your due date, unless your physician or employer advises otherwise.  Round Ligament Pain/Pelvic Discomfort:  Sharp, shooting pains not associated with bleeding are fairly common, usually occurring in the second trimester of pregnancy.  They tend to be worse when standing up or  when you remain standing for long periods of time.  These are the result of pressure of certain pelvic ligaments called "round ligaments".  Rest, Tylenol and heat seem to be the most effective relief.  As the womb and fetus grow, they rise out of the pelvis and the discomfort improves.  Please notify the office if your pain seems different than that described.  It may represent a more serious condition.  Common Medications Safe in Pregnancy  Acne:      Constipation:  Benzoyl Peroxide     Colace  Clindamycin      Dulcolax Suppository  Topica Erythromycin     Fibercon  Salicylic Acid      Metamucil         Miralax AVOID:        Senakot   Accutane    Cough:  Retin-A       Cough Drops  Tetracycline      Phenergan w/ Codeine if Rx  Minocycline      Robitussin (Plain & DM)  Antibiotics:     Crabs/Lice:  Ceclor       RID  Cephalosporins    AVOID:  E-Mycins      Kwell  Keflex  Macrobid/Macrodantin   Diarrhea:  Penicillin      Kao-Pectate  Zithromax      Imodium AD         PUSH FLUIDS AVOID:       Cipro     Fever:  Tetracycline      Tylenol (Regular or  Extra  Minocycline       Strength)  Levaquin      Extra Strength-Do not          Exceed 8 tabs/24 hrs Caffeine:        <224m/day (equiv. To 1 cup of coffee or  approx. 3 12 oz sodas)         Gas: Cold/Hayfever:       Gas-X  Benadryl      Mylicon  Claritin       Phazyme  **Claritin-D        Chlor-Trimeton    Headaches:  Dimetapp      ASA-Free Excedrin  Drixoral-Non-Drowsy     Cold Compress  Mucinex (Guaifenasin)     Tylenol (Regular or Extra  Sudafed/Sudafed-12 Hour     Strength)  **Sudafed PE Pseudoephedrine   Tylenol Cold & Sinus     Vicks Vapor Rub  Zyrtec  **AVOID if Problems With Blood Pressure         Heartburn: Avoid lying down for at least 1 hour after meals  Aciphex      Maalox     Rash:  Milk of Magnesia     Benadryl    Mylanta       1% Hydrocortisone Cream  Pepcid  Pepcid Complete   Sleep Aids:  Prevacid      Ambien   Prilosec       Benadryl  Rolaids       Chamomile Tea  Tums (Limit 4/day)     Unisom  Zantac       Tylenol PM         Warm milk-add vanilla or  Hemorrhoids:       Sugar for taste  Anusol/Anusol H.C.  (RX: Analapram 2.5%)  Sugar Substitutes:  Hydrocortisone OTC     Ok in moderation  Preparation H      Tucks  Vaseline lotion applied to tissue with wiping    Herpes:     Throat:  Acyclovir      Oragel  Famvir  Valtrex     Vaccines:         Flu Shot Leg Cramps:       *Gardasil  Benadryl      Hepatitis A         Hepatitis B Nasal Spray:       Pneumovax  Saline Nasal Spray     Polio Booster         Tetanus Nausea:       Tuberculosis test or PPD  Vitamin B6 25 mg TID   AVOID:    Dramamine      *Gardasil  Emetrol       Live Poliovirus  Ginger Root 250 mg QID    MMR (measles, mumps &  High Complex Carbs @ Bedtime    rebella)  Sea Bands-Accupressure    Varicella (Chickenpox)  Unisom 1/2 tab TID     *No known complications           If received before Pain:         Known pregnancy;   Darvocet       Resume series  after  Lortab        Delivery  Percocet    Yeast:   Tramadol      Femstat  Tylenol 3      Gyne-lotrimin  Ultram       Monistat  Vicodin           MISC:         All Sunscreens           Hair Coloring/highlights          Insect Repellant's          (Including DEET)         Mystic Tans

## 2018-06-21 NOTE — Progress Notes (Signed)
      Francine Graven presents for NOB nurse intake visit. Pregnancy confirmation done at [facility],06/07/18, with [facility].  G1.  P0.  LMP 04/24/18.  EDD.  Ga. Pregnancy education material explained and given.  0 cats in the home.  NOB labs ordered. BMI greater than 30. TSH/HbgA1c ordered. HIV and drug screen explained and ordered. Genetic screening discussed. Genetic testing; Unsure. Pt to discuss genetic testing with provider. PNV encouraged. Pt to follow up with provider in 3 weeks for NOB physical.  [facility] Financial Policy reviewed and explained. FMLA form completed and signed. Pt stated that she understood all information given.    BP 112/73   Pulse 83   Ht 5\' 7"  (1.702 m)   Wt 200 lb 9.6 oz (91 kg)   LMP 04/24/2018   BMI 31.42 kg/m

## 2018-06-21 NOTE — Telephone Encounter (Signed)
Called pt to see if she could come in at 2pm instead of 3pm pt stated that she couldn't due to her job.

## 2018-06-22 LAB — URINALYSIS, ROUTINE W REFLEX MICROSCOPIC
Bilirubin, UA: NEGATIVE
Glucose, UA: NEGATIVE
LEUKOCYTES UA: NEGATIVE
Nitrite, UA: NEGATIVE
Protein, UA: NEGATIVE
RBC, UA: NEGATIVE
Specific Gravity, UA: 1.022 (ref 1.005–1.030)
Urobilinogen, Ur: 0.2 mg/dL (ref 0.2–1.0)
pH, UA: 6.5 (ref 5.0–7.5)

## 2018-06-22 LAB — RUBELLA SCREEN: Rubella Antibodies, IGG: 4.35 index (ref >0.99)

## 2018-06-22 LAB — HIV ANTIBODY (ROUTINE TESTING W REFLEX): HIV SCREEN 4TH GENERATION: NONREACTIVE

## 2018-06-22 LAB — TSH: TSH: 1.46 u[IU]/mL (ref 0.450–4.500)

## 2018-06-22 LAB — HEPATITIS B SURFACE ANTIGEN: Hepatitis B Surface Ag: NEGATIVE

## 2018-06-22 LAB — VARICELLA ZOSTER ANTIBODY, IGG: Varicella zoster IgG: 1035 index (ref >165)

## 2018-06-22 LAB — HEMOGLOBIN A1C
Est. average glucose Bld gHb Est-mCnc: 100 mg/dL
Hgb A1c MFr Bld: 5.1 % (ref 4.8–5.6)

## 2018-06-22 LAB — ABO AND RH: Rh Factor: POSITIVE

## 2018-06-22 LAB — RPR: RPR Ser Ql: NONREACTIVE

## 2018-06-22 LAB — ANTIBODY SCREEN: Antibody Screen: NEGATIVE

## 2018-06-23 LAB — URINE CULTURE, OB REFLEX

## 2018-06-23 LAB — CULTURE, OB URINE

## 2018-06-23 LAB — GC/CHLAMYDIA PROBE AMP
Chlamydia trachomatis, NAA: POSITIVE — AB
Neisseria gonorrhoeae by PCR: NEGATIVE

## 2018-06-25 ENCOUNTER — Other Ambulatory Visit: Payer: Self-pay

## 2018-06-25 LAB — DRUG PROFILE, UR, 9 DRUGS (LABCORP)
AMPHETAMINES, URINE: NEGATIVE ng/mL
BARBITURATE QUANT UR: NEGATIVE ng/mL
Benzodiazepine Quant, Ur: NEGATIVE ng/mL
Cannabinoid Quant, Ur: POSITIVE — AB
Cocaine (Metab.): NEGATIVE ng/mL
Methadone Screen, Urine: NEGATIVE ng/mL
Opiate Quant, Ur: NEGATIVE ng/mL
PCP Quant, Ur: NEGATIVE ng/mL
Propoxyphene: NEGATIVE ng/mL

## 2018-06-25 LAB — NICOTINE SCREEN, URINE: Cotinine Ql Scrn, Ur: NEGATIVE ng/mL

## 2018-06-25 MED ORDER — AZITHROMYCIN 500 MG PO TABS: 1000.0000 mg | ORAL_TABLET | Freq: Every day | ORAL | 1 refills | Status: DC

## 2018-06-25 NOTE — Progress Notes (Signed)
Please inform patient of abnormal testing:  1. Chlamydia - needs prescription for Azithromycin 1 gram.  Needs partner testing/treatement. Can refill x 1 (can treat partner if no allergies). Advised on no interocourse or protected intercourse for 7 days after both partners treated.  2. Patient positive for marijuana, is recommended for cessation during pregnancy.

## 2018-06-28 NOTE — Telephone Encounter (Signed)
The patient is asking for a note stating her work restrictions due to her pregnancy.  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  She needs something for her job to let them know she is not able to do that at this time, and she is asking if you can send the note to her MyChart, please advise, thanks.

## 2018-07-11 ENCOUNTER — Telehealth: Payer: Self-pay | Admitting: Obstetrics and Gynecology

## 2018-07-11 NOTE — Telephone Encounter (Signed)
The patient was upset that she could not have a visitor with her, and she wants the baby's daddy to be there so she moved her appointment out to see if the NO VISITOR policy will be lifted by the time she comes April 2 at 10 AM.  It was expressed to the patient that we have no way to tracking who to call when the policy changes, and the patient voiced understanding, but she was crying, and also was appreciative that I tried to help her, NO FURTHER ACTION REQUIRED AT THIS TIME.

## 2018-07-11 NOTE — Progress Notes (Deleted)
Coronavirus (COVID-19) Are you at risk?  Are you at risk for the Coronavirus (COVID-19)?  To be considered HIGH RISK for Coronavirus (COVID-19), you have to meet the following criteria:  . Traveled to [place], [place], [place], [place] or [place]; or in the [place] to [place], [place], [place], or [place]; and have fever, cough, and shortness of breath within the last 2 weeks of travel OR . Been in close contact with a person diagnosed with COVID-19 within the last 2 weeks and have fever, cough, and shortness of breath . IF YOU DO NOT MEET THESE CRITERIA, YOU ARE CONSIDERED LOW RISK FOR COVID-19.  What to do if you are HIGH RISK for COVID-19?  . If you are having a medical emergency, call 911. . Seek medical care right away. Before you go to a doctor's office, urgent care or emergency department, call ahead and tell them about your recent travel, contact with someone diagnosed with COVID-19, and your symptoms. You should receive instructions from your physician's office regarding next steps of care.  . When you arrive at healthcare provider, tell the healthcare staff immediately you have returned from visiting [place], [place], [place], [place] or [place]; or traveled in the [place] to [place], [place], [place], or [place]; in the last two weeks or you have been in close contact with a person diagnosed with COVID-19 in the last 2 weeks.   . Tell the health care staff about your symptoms: fever, cough and shortness of breath. . After you have been seen by a medical provider, you will be either: o Tested for (COVID-19) and discharged home on quarantine except to seek medical care if symptoms worsen, and asked to  - Stay home and avoid contact with others until you get your results (4-5 days)  - Avoid travel on public transportation if possible (such as bus, train, or airplane) or o Sent to the Emergency Department by EMS for evaluation, COVID-19 testing, and possible  admission depending on your condition and test results.  What to do if you are LOW RISK for COVID-19?  Reduce your risk of any infection by using the same precautions used for avoiding the common cold or flu:  . Wash your hands often with soap and warm water for at least 20 seconds.  If soap and water are not readily available, use an alcohol-based hand sanitizer with at least 60% alcohol.  . If coughing or sneezing, cover your mouth and nose by coughing or sneezing into the elbow areas of your shirt or coat, into a tissue or into your sleeve (not your hands). . Avoid shaking hands with others and consider head nods or verbal greetings only. . Avoid touching your eyes, nose, or mouth with unwashed hands.  . Avoid close contact with people who are sick. . Avoid places or events with large numbers of people in one location, like concerts or sporting events. . Carefully consider travel plans you have or are making. . If you are planning any travel outside or inside the [place], visit the CDC's Travelers' Health webpage for the latest health notices. . If you have some symptoms but not all symptoms, continue to monitor at home and seek medical attention if your symptoms worsen. . If you are having a medical emergency, call 911.  Spoke with pt denies any sx. Amy Clontz, CMA   ADDITIONAL HEALTHCARE OPTIONS FOR PATIENTS  [facility] / e-Visit: [email or web address]           [facility]: [phone]  [facility]: [phone]                   [facility]: [phone]  

## 2018-07-12 ENCOUNTER — Encounter: Payer: BLUE CROSS/BLUE SHIELD | Admitting: Obstetrics and Gynecology

## 2018-07-19 ENCOUNTER — Other Ambulatory Visit (HOSPITAL_COMMUNITY)
Admission: RE | Admit: 2018-07-19 | Discharge: 2018-07-19 | Disposition: A | Discharge status: Home / Self Care | Payer: BLUE CROSS/BLUE SHIELD | Source: Ambulatory Visit | Attending: Obstetrics and Gynecology | Admitting: Obstetrics and Gynecology

## 2018-07-19 ENCOUNTER — Ambulatory Visit (INDEPENDENT_AMBULATORY_CARE_PROVIDER_SITE_OTHER): Payer: BLUE CROSS/BLUE SHIELD | Admitting: Obstetrics and Gynecology

## 2018-07-19 ENCOUNTER — Other Ambulatory Visit: Payer: Self-pay

## 2018-07-19 VITALS — BP 110/74 | HR 88 | Wt 191.4 lb

## 2018-07-19 DIAGNOSIS — Z3492 Encounter for supervision of normal pregnancy, unspecified, second trimester: Secondary | ICD-10-CM

## 2018-07-19 DIAGNOSIS — Z3401 Encounter for supervision of normal first pregnancy, first trimester: Secondary | ICD-10-CM

## 2018-07-19 DIAGNOSIS — Z3A12 12 weeks gestation of pregnancy: Secondary | ICD-10-CM

## 2018-07-19 LAB — POCT URINALYSIS DIPSTICK OB
Bilirubin, UA: NEGATIVE
Blood, UA: NEGATIVE
Glucose, UA: NEGATIVE
Ketones, UA: 40
Leukocytes, UA: NEGATIVE
Nitrite, UA: NEGATIVE
POC,PROTEIN,UA: NEGATIVE
Spec Grav, UA: 1.01 (ref 1.010–1.025)
Urobilinogen, UA: 0.2 E.U./dL
pH, UA: 6.5 (ref 5.0–8.0)

## 2018-07-19 NOTE — Progress Notes (Signed)
NOB PE- pt is doing well, having some nausea

## 2018-07-19 NOTE — Progress Notes (Signed)
NEW OB HISTORY AND PHYSICAL  SUBJECTIVE:       Isabella Weaver is a 28 y.o. G20P0000 female, Patient's last menstrual period was 04/24/2018., Estimated Date of Delivery: 01/29/19, [redacted]w[redacted]d, presents today for establishment of Prenatal Care. She has no unusual complaints and complains of nausea      Gynecologic History Patient's last menstrual period was 04/24/2018. Normal Contraception: none Last Pap: 2018. Results were: normal  Obstetric History OB History  Gravida Para Term Preterm AB Living  1 0 0 0 0 0  SAB TAB Ectopic Multiple Live Births  0 0 0 0      # Outcome Date GA Lbr Len/2nd Weight Sex Delivery Anes PTL Lv  1 Current             Past Medical History:  Diagnosis Date  . Painful menstrual periods   . Vitamin D deficiency     No past surgical history on file.  Current Outpatient Medications on File Prior to Visit  Medication Sig Dispense Refill  . Prenatal Vit-Fe Fumarate-FA (MULTIVITAMIN-PRENATAL) 27-0.8 MG TABS tablet Take 1 tablet by mouth daily at 12 noon.    Marland Kitchen azithromycin (ZITHROMAX) 500 MG tablet Take 2 tablets (1,000 mg total) by mouth daily. (Patient not taking: Reported on 07/19/2018) 2 tablet 1   No current facility-administered medications on file prior to visit.     Allergies  Allergen Reactions  . Latex     Social History   Socioeconomic History  . Marital status: Single    Spouse name: Not on file  . Number of children: Not on file  . Years of education: Not on file  . Highest education level: Not on file  Occupational History  . Not on file  Social Needs  . Financial resource strain: Not on file  . Food insecurity:    Worry: Not on file    Inability: Not on file  . Transportation needs:    Medical: Not on file    Non-medical: Not on file  Tobacco Use  . Smoking status: Never Smoker  . Smokeless tobacco: Never Used  Substance and Sexual Activity  . Alcohol use: Not Currently    Comment: occas  . Drug use: No  . Sexual activity:  Yes    Birth control/protection: None  Lifestyle  . Physical activity:    Days per week: Not on file    Minutes per session: Not on file  . Stress: Not on file  Relationships  . Social connections:    Talks on phone: Not on file    Gets together: Not on file    Attends religious service: Not on file    Active member of club or organization: Not on file    Attends meetings of clubs or organizations: Not on file    Relationship status: Not on file  . Intimate partner violence:    Fear of current or ex partner: Not on file    Emotionally abused: Not on file    Physically abused: Not on file    Forced sexual activity: Not on file  Other Topics Concern  . Not on file  Social History Narrative  . Not on file    Family History  Problem Relation Age of Onset  . Diabetes Maternal Aunt   . Cancer Mother   . Asthma Father     The following portions of the patient's history were reviewed and updated as appropriate: allergies, current medications, past OB history, past medical history, past  surgical history, past family history, past social history, and problem list.    OBJECTIVE: Initial Physical Exam (New OB)  GENERAL APPEARANCE: alert, well appearing, in no apparent distress, oriented to person, place and time HEAD: normocephalic, atraumatic MOUTH: mucous membranes moist, pharynx normal without lesions and dental hygiene good THYROID: no thyromegaly or masses present BREASTS: not examined LUNGS: clear to auscultation, no wheezes, rales or rhonchi, symmetric air entry HEART: regular rate and rhythm, no murmurs ABDOMEN: soft, nontender, nondistended, no abnormal masses, no epigastric pain, fundus not palpable and FHT present EXTREMITIES: no redness or tenderness in the calves or thighs SKIN: normal coloration and turgor, no rashes LYMPH NODES: no adenopathy palpable NEUROLOGIC: alert, oriented, normal speech, no focal findings or movement disorder noted  PELVIC EXAM EXTERNAL  GENITALIA: normal appearing vulva with no masses, tenderness or lesions VAGINA: no abnormal discharge or lesions CERVIX: no lesions or cervical motion tenderness UTERUS: gravid and consistent with 12 weeks  ASSESSMENT: Normal pregnancy BMI 30 +CMZ-treated 06/25/18  PLAN: Prenatal care Genetic screening done today Pap obtained with TOC included See orders

## 2018-07-19 NOTE — Patient Instructions (Signed)
FREQUENTLY ASKED QUESTIONS FOR OBSTETRICS/PEDIATRICS    Q: Why are visitor restrictions different for maternity care areas?  [place] is restricting visitors for the duration of the patient's hospitalization. The birth of a child involves the mother, considered the patient, and a birthing partner. These are unprecedented times and we are making the exception to allow a birthing partner to be a part of the patient unit. No other guests will be allowed in our [place] at [facility] and at [facility].   Q: Are credentialed doulas allowed to support their existing patients?  We acknowledge the value these doula partnerships offer our care teams and many birthing families in our communities. Each laboring mother is allowed one birthing partner of the patient's choosing for her entire hospitalization.   Q: Are visitor restrictions different for hospitalized children?  Pediatric patients (infants and children under 1 years of age), such as those in the Children's Unit, Pediatric ICU and NICU, will be allowed two visitors (parents or legal guardians)   Q: Are pregnant women at an increased risk for COVID-19?  The [employer] of Obstetricians and Gynecologists (ACOG) is monitoring closely the coronavirus pandemic. With the limited information available, data does not indicate pregnant women are at an increased risk. However, pregnant women are known to be at greater risk for respiratory infections like flu. With that in mind, expectant mothers are considered an at-risk population for COVID-19, according to ACOG.   Q: Are newborns at an increased risk for COVID-19?  A limited sample of COVID-19 data with newborns indicates the virus is not transferred to the infant during pregnancy. However, postpartum separation is recommended by the Centers for  Disease Control (CDC). As a result [place] recommends and strongly encourages temporary separation of moms and babies who test positive for COVID-19 or are awaiting results to rule out COVID-19 based on CDC guidelines.   Q: If you have a suspected case of COVID-19, is the NICU couplet care room an option?  No. If either patient is considered at-risk for having COVID-19, the [place] [facility] will not use the NICU couplet care rooms for that family.   Q: [place] is urging that elective procedures be postponed. What is considered elective for women's and children's service line?  NOT ELECTIVE: Obstetric procedures, even those with an element of choice on timing, are not considered elective. Circumcisions are considered elective procedures, however, these do not deplete blood products and other resources, which is the spirit in which the COVID-19 postponement of elective procedures was intended. Therefore, circumcisions will be allowed.   ELECTIVE: Postpartum tubal ligations are considered elective and should be postponed. Q&A for Obstetricians, Gynecologists and Pediatricians  Published July 06, 2018   [facility] supports as much as possible the medical care  team working with the patient's individual needs to address timing during these unprecedented times. We seek the support of our medical care team in preserving needed resources throughout our crisis response to COVID-19.   Q: How does COVID-19 impact breastfeeding?  Breastmilk is safe for your baby - even if the mother has tested positive for COVID-19. If a COVID-19+ mother decides to breastfeed while inpatient and after discharge, we suggest proper protective equipment be worn and hand hygiene be performed before and after feeding the infant. The new mother also has the option to pump her milk and have a healthy family member feed the baby to protect the baby from getting the virus.   Q: Should we urge  patients to avoid baby showers and large gatherings?  Yes. As has been recommended for all citizens in our communities, gatherings of 10 or more should be avoided - pregnant or not. Seek creative options for "hosting" baby showers through electronic means that honor the request for social distancing during this time of heightened awareness.   Q: Should patients miss their prenatal appointments?  No. Prenatal visits are NOT elective. While we want to limit contact and exposure, prenatal care is vital right now. Contact your physician's office if you have concerns about your visits. We are limiting outpatient office visits to the patient and one guest in order to reduce the potential for exposure.   Q: What if a pregnant woman feels sick? Should she miss her prenatal visit then?  A pregnant woman experiencing coronavirus-like symptoms (i.e., cough, fever, difficulty breathing, shortness of breath, gastrointestinal issues) should contact her pregnancy care provider by phone. Her medical professional can best determine whether she should use a video visit or possibly go to a collection site to be tested for COVID-19. Contacting her primary care provider or her pregnancy care provider is her first step.   Q: What can I do about childbirth education? All the classes are cancelled.  The [facility] [place] will offer online learning to support mothers on their journey. We currently offer Understanding Childbirth, Understanding Breastfeeding and Understanding Newborn Care as an online class. Please visit our website, [email or web address], to register for an online class.   Q: How can I keep from getting COVID-19? Q&A for Obstetricians, Gynecologists and Pediatricians  Published July 06, 2018   Together, we can reduce the risk of exposure to the virus and help you and your family remain healthy and safe. One of the best ways to protect yourself is to wash your hands frequently using soap and  water. Also, you should avoid touching your eyes, nose and mouth with unwashed hands, avoid physical contact with others and practice social distancing.   Q: How are employees being informed about what to do?  [place] leaders receive a daily COVID-19 update and share relevant information with their teams. This is a time when health care professionals are called on to lead within our community. We appreciate our staff's engagement with our COVID-19 updates and encourage them to share best practices on reducing the spread of the virus with our patients and community. We are prepared to provide the exceptional COVID-19 care and coordination our community needs, expects and deserves.   Q: Who's in charge of this issue at [facility]?  The leadership structure and process established to address COVID-19 includes Chief Physician Executive Phoebe Sharps, MD; Infection Prevention Medical Director Carlyle Basques, MD; and Infection Prevention Interim Director Hubert Azure, MSN, RN, CIC, CSPDT. A team  of [place] experts reflecting a broad spectrum of our workforce is meeting daily to evaluate new information we receive about COVID-19 and to adapt policies and practices accordingly.                         Published July 06, 2018     Second Trimester of Pregnancy The second trimester is from week 14 through week 27 (months 4 through 6). The second trimester is often a time when you feel your best. Your body has adjusted to being pregnant, and you begin to feel better physically. Usually, morning sickness has lessened or quit completely, you may have more energy, and you may have an increase in appetite. The second trimester is also a time when the fetus is growing rapidly. At the end of the sixth month, the fetus is about 9 inches long and weighs about 1 pounds. You will likely begin to feel the baby move (quickening) between 16 and 20 weeks of pregnancy. Body changes during your second trimester Your body  continues to go through many changes during your second trimester. The changes vary from woman to woman.  Your weight will continue to increase. You will notice your lower abdomen bulging out.  You may begin to get stretch marks on your hips, abdomen, and breasts.  You may develop headaches that can be relieved by medicines. The medicines should be approved by your health care provider.  You may urinate more often because the fetus is pressing on your bladder.  You may develop or continue to have heartburn as a result of your pregnancy.  You may develop constipation because certain hormones are causing the muscles that push waste through your intestines to slow down.  You may develop hemorrhoids or swollen, bulging veins (varicose veins).  You may have back pain. This is caused by: ? Weight gain. ? Pregnancy hormones that are relaxing the joints in your pelvis. ? A shift in weight and the muscles that support your balance.  Your breasts will continue to grow and they will continue to become tender.  Your gums may bleed and may be sensitive to brushing and flossing.  Dark spots or blotches (chloasma, mask of pregnancy) may develop on your face. This will likely fade after the baby is born.  A dark line from your belly button to the pubic area (linea nigra) may appear. This will likely fade after the baby is born.  You may have changes in your hair. These can include thickening of your hair, rapid growth, and changes in texture. Some women also have hair loss during or after pregnancy, or hair that feels dry or thin. Your hair will most likely return to normal after your baby is born. What to expect at prenatal visits During a routine prenatal visit:  You will be weighed to make sure you and the fetus are growing normally.  Your blood pressure will be taken.  Your abdomen will be measured to track your baby's growth.  The fetal heartbeat will be listened to.  Any test results  from the previous visit will be discussed. Your health care provider may ask you:  How you are feeling.  If you are feeling the baby move.  If you have had any abnormal symptoms, such as leaking fluid, bleeding, severe headaches, or abdominal cramping.  If you are using any tobacco products, including cigarettes, chewing tobacco, and electronic cigarettes.  If you have any questions. Other tests that  may be performed during your second trimester include:  Blood tests that check for: ? Low iron levels (anemia). ? High blood sugar that affects pregnant women (gestational diabetes) between 57 and 28 weeks. ? Rh antibodies. This is to check for a protein on red blood cells (Rh factor).  Urine tests to check for infections, diabetes, or protein in the urine.  An ultrasound to confirm the proper growth and development of the baby.  An amniocentesis to check for possible genetic problems.  Fetal screens for spina bifida and Down syndrome.  HIV (human immunodeficiency virus) testing. Routine prenatal testing includes screening for HIV, unless you choose not to have this test. Follow these instructions at home: Medicines  Follow your health care provider's instructions regarding medicine use. Specific medicines may be either safe or unsafe to take during pregnancy.  Take a prenatal vitamin that contains at least 600 micrograms (mcg) of folic acid.  If you develop constipation, try taking a stool softener if your health care provider approves. Eating and drinking   Eat a balanced diet that includes fresh fruits and vegetables, whole grains, good sources of protein such as meat, eggs, or tofu, and low-fat dairy. Your health care provider will help you determine the amount of weight gain that is right for you.  Avoid raw meat and uncooked cheese. These carry germs that can cause birth defects in the baby.  If you have low calcium intake from food, talk to your health care provider  about whether you should take a daily calcium supplement.  Limit foods that are high in fat and processed sugars, such as fried and sweet foods.  To prevent constipation: ? Drink enough fluid to keep your urine clear or pale yellow. ? Eat foods that are high in fiber, such as fresh fruits and vegetables, whole grains, and beans. Activity  Exercise only as directed by your health care provider. Most women can continue their usual exercise routine during pregnancy. Try to exercise for 30 minutes at least 5 days a week. Stop exercising if you experience uterine contractions.  Avoid heavy lifting, wear low heel shoes, and practice good posture.  A sexual relationship may be continued unless your health care provider directs you otherwise. Relieving pain and discomfort  Wear a good support bra to prevent discomfort from breast tenderness.  Take warm sitz baths to soothe any pain or discomfort caused by hemorrhoids. Use hemorrhoid cream if your health care provider approves.  Rest with your legs elevated if you have leg cramps or low back pain.  If you develop varicose veins, wear support hose. Elevate your feet for 15 minutes, 3-4 times a day. Limit salt in your diet. Prenatal Care  Write down your questions. Take them to your prenatal visits.  Keep all your prenatal visits as told by your health care provider. This is important. Safety  Wear your seat belt at all times when driving.  Make a list of emergency phone numbers, including numbers for family, friends, the hospital, and police and fire departments. General instructions  Ask your health care provider for a referral to a local prenatal education class. Begin classes no later than the beginning of month 6 of your pregnancy.  Ask for help if you have counseling or nutritional needs during pregnancy. Your health care provider can offer advice or refer you to specialists for help with various needs.  Do not use hot tubs, steam  rooms, or saunas.  Do not douche or use tampons or  scented sanitary pads.  Do not cross your legs for long periods of time.  Avoid cat litter boxes and soil used by cats. These carry germs that can cause birth defects in the baby and possibly loss of the fetus by miscarriage or stillbirth.  Avoid all smoking, herbs, alcohol, and unprescribed drugs. Chemicals in these products can affect the formation and growth of the baby.  Do not use any products that contain nicotine or tobacco, such as cigarettes and e-cigarettes. If you need help quitting, ask your health care provider.  Visit your dentist if you have not gone yet during your pregnancy. Use a soft toothbrush to brush your teeth and be gentle when you floss. Contact a health care provider if:  You have dizziness.  You have mild pelvic cramps, pelvic pressure, or nagging pain in the abdominal area.  You have persistent nausea, vomiting, or diarrhea.  You have a bad smelling vaginal discharge.  You have pain when you urinate. Get help right away if:  You have a fever.  You are leaking fluid from your vagina.  You have spotting or bleeding from your vagina.  You have severe abdominal cramping or pain.  You have rapid weight gain or weight loss.  You have shortness of breath with chest pain.  You notice sudden or extreme swelling of your face, hands, ankles, feet, or legs.  You have not felt your baby move in over an hour.  You have severe headaches that do not go away when you take medicine.  You have vision changes. Summary  The second trimester is from week 14 through week 27 (months 4 through 6). It is also a time when the fetus is growing rapidly.  Your body goes through many changes during pregnancy. The changes vary from woman to woman.  Avoid all smoking, herbs, alcohol, and unprescribed drugs. These chemicals affect the formation and growth your baby.  Do not use any tobacco products, such as cigarettes,  chewing tobacco, and e-cigarettes. If you need help quitting, ask your health care provider.  Contact your health care provider if you have any questions. Keep all prenatal visits as told by your health care provider. This is important. This information is not intended to replace advice given to you by your health care provider. Make sure you discuss any questions you have with your health care provider. Document Released: 03/29/2001 Document Revised: 05/10/2016 Document Reviewed: 05/10/2016 Elsevier Interactive Patient Education  2019 [employer].

## 2018-07-20 LAB — CBC WITH DIFFERENTIAL/PLATELET
Basophils Absolute: 0.1 10*3/uL (ref 0.0–0.2)
Basos: 1 %
EOS (ABSOLUTE): 0.1 10*3/uL (ref 0.0–0.4)
Eos: 1 %
Hematocrit: 37.5 % (ref 34.0–46.6)
Hemoglobin: 13 g/dL (ref 11.1–15.9)
Immature Grans (Abs): 0.1 10*3/uL (ref 0.0–0.1)
Immature Granulocytes: 1 %
Lymphocytes Absolute: 1.9 10*3/uL (ref 0.7–3.1)
Lymphs: 20 %
MCH: 32.5 pg (ref 26.6–33.0)
MCHC: 34.7 g/dL (ref 31.5–35.7)
MCV: 94 fL (ref 79–97)
Monocytes Absolute: 0.6 10*3/uL (ref 0.1–0.9)
Monocytes: 6 %
Neutrophils Absolute: 7 10*3/uL (ref 1.4–7.0)
Neutrophils: 71 %
Platelets: 315 10*3/uL (ref 150–450)
RBC: 4 x10E6/uL (ref 3.77–5.28)
RDW: 12.4 % (ref 11.7–15.4)
WBC: 9.8 10*3/uL (ref 3.4–10.8)

## 2018-07-23 LAB — CYTOLOGY - PAP
Chlamydia: NEGATIVE
Diagnosis: NEGATIVE
Neisseria Gonorrhea: NEGATIVE

## 2018-08-16 ENCOUNTER — Encounter: Payer: Self-pay | Admitting: Certified Nurse Midwife

## 2018-08-17 ENCOUNTER — Encounter: Payer: Self-pay | Admitting: *Deleted

## 2018-08-17 ENCOUNTER — Other Ambulatory Visit: Payer: Self-pay | Admitting: *Deleted

## 2018-08-29 ENCOUNTER — Telehealth: Payer: Self-pay | Admitting: *Deleted

## 2018-08-29 NOTE — Telephone Encounter (Signed)
Coronavirus (COVID-19) Are you at risk?  Are you at risk for the Coronavirus (COVID-19)?  To be considered HIGH RISK for Coronavirus (COVID-19), you have to meet the following criteria:  . Traveled to [place], [place], [place], [place] or [place]; or in the [place] to [place], [place], [place], or [place]; and have fever, cough, and shortness of breath within the last 2 weeks of travel OR . Been in close contact with a person diagnosed with COVID-19 within the last 2 weeks and have fever, cough, and shortness of breath . IF YOU DO NOT MEET THESE CRITERIA, YOU ARE CONSIDERED LOW RISK FOR COVID-19.  What to do if you are HIGH RISK for COVID-19?  . If you are having a medical emergency, call 911. . Seek medical care right away. Before you go to a doctor's office, urgent care or emergency department, call ahead and tell them about your recent travel, contact with someone diagnosed with COVID-19, and your symptoms. You should receive instructions from your physician's office regarding next steps of care.  . When you arrive at healthcare provider, tell the healthcare staff immediately you have returned from visiting [place], [place], [place], [place] or [place]; or traveled in the [place] to [place], [place], [place], or [place]; in the last two weeks or you have been in close contact with a person diagnosed with COVID-19 in the last 2 weeks.   . Tell the health care staff about your symptoms: fever, cough and shortness of breath. . After you have been seen by a medical provider, you will be either: o Tested for (COVID-19) and discharged home on quarantine except to seek medical care if symptoms worsen, and asked to  - Stay home and avoid contact with others until you get your results (4-5 days)  - Avoid travel on public transportation if possible (such as bus, train, or airplane) or o Sent to the Emergency Department by EMS for evaluation, COVID-19 testing, and possible  admission depending on your condition and test results.  What to do if you are LOW RISK for COVID-19?  Reduce your risk of any infection by using the same precautions used for avoiding the common cold or flu:  . Wash your hands often with soap and warm water for at least 20 seconds.  If soap and water are not readily available, use an alcohol-based hand sanitizer with at least 60% alcohol.  . If coughing or sneezing, cover your mouth and nose by coughing or sneezing into the elbow areas of your shirt or coat, into a tissue or into your sleeve (not your hands). . Avoid shaking hands with others and consider head nods or verbal greetings only. . Avoid touching your eyes, nose, or mouth with unwashed hands.  . Avoid close contact with people who are sick. . Avoid places or events with large numbers of people in one location, like concerts or sporting events. . Carefully consider travel plans you have or are making. . If you are planning any travel outside or inside the [place], visit the CDC's Travelers' Health webpage for the latest health notices. . If you have some symptoms but not all symptoms, continue to monitor at home and seek medical attention if your symptoms worsen. . If you are having a medical emergency, call 911.   ADDITIONAL HEALTHCARE OPTIONS FOR PATIENTS  [place] [facility] / e-Visit: [email or web address]         [facility]: [phone]  [place]   [facility]: [phone]                   [facility]: [phone]   Spoke with pt denies any sx.  Amy Clontz, CMA 

## 2018-08-30 ENCOUNTER — Ambulatory Visit (INDEPENDENT_AMBULATORY_CARE_PROVIDER_SITE_OTHER): Payer: BLUE CROSS/BLUE SHIELD | Admitting: Certified Nurse Midwife

## 2018-08-30 ENCOUNTER — Other Ambulatory Visit: Payer: Self-pay

## 2018-08-30 VITALS — BP 127/72 | HR 87 | Wt 203.6 lb

## 2018-08-30 DIAGNOSIS — Z3492 Encounter for supervision of normal pregnancy, unspecified, second trimester: Secondary | ICD-10-CM

## 2018-08-30 DIAGNOSIS — Z8619 Personal history of other infectious and parasitic diseases: Secondary | ICD-10-CM

## 2018-08-30 LAB — POCT URINALYSIS DIPSTICK OB
Bilirubin, UA: NEGATIVE
Blood, UA: NEGATIVE
Glucose, UA: NEGATIVE
Ketones, UA: NEGATIVE
Leukocytes, UA: NEGATIVE
Nitrite, UA: NEGATIVE
POC,PROTEIN,UA: NEGATIVE
Spec Grav, UA: <=1.005 — AB (ref 1.010–1.025)
Urobilinogen, UA: 0.2 E.U./dL
pH, UA: 6 (ref 5.0–8.0)

## 2018-08-30 NOTE — Patient Instructions (Signed)

## 2018-08-30 NOTE — Progress Notes (Signed)
[name]-Feeling better in the second trimester. Completed Rx; TOC today, see orders. Naming daughter, De Hollingshead. Education regarding pregnancy weight gain. Advised all childbirth classes moved to the online format, see website for details. Desires natural childbirth and plans breastfeeding. Anticipatory guidance regarding course of prenatal care. Reviewed red flag symptoms and when to call. RTC x 2-3 weeks for ANATOMY SCAN and [name] or sooner if needed.

## 2018-09-04 LAB — GC/CHLAMYDIA PROBE AMP
Chlamydia trachomatis, NAA: NEGATIVE
Neisseria Gonorrhoeae by PCR: NEGATIVE

## 2018-09-07 ENCOUNTER — Telehealth: Payer: Self-pay

## 2018-09-07 NOTE — Telephone Encounter (Signed)
Coronavirus (COVID-19) Are you at risk?  Are you at risk for the Coronavirus (COVID-19)?  To be considered HIGH RISK for Coronavirus (COVID-19), you have to meet the following criteria:  . Traveled to [place], [place], [place], [place] or [place]; or in the [place] to [place], [place], [place], or [place]; and have fever, cough, and shortness of breath within the last 2 weeks of travel OR . Been in close contact with a person diagnosed with COVID-19 within the last 2 weeks and have fever, cough, and shortness of breath . IF YOU DO NOT MEET THESE CRITERIA, YOU ARE CONSIDERED LOW RISK FOR COVID-19.  What to do if you are HIGH RISK for COVID-19?  . If you are having a medical emergency, call 911. . Seek medical care right away. Before you go to a doctor's office, urgent care or emergency department, call ahead and tell them about your recent travel, contact with someone diagnosed with COVID-19, and your symptoms. You should receive instructions from your physician's office regarding next steps of care.  . When you arrive at healthcare provider, tell the healthcare staff immediately you have returned from visiting [place], [place], [place], [place] or [place]; or traveled in the [place] to [place], [place], [place], or [place]; in the last two weeks or you have been in close contact with a person diagnosed with COVID-19 in the last 2 weeks.   . Tell the health care staff about your symptoms: fever, cough and shortness of breath. . After you have been seen by a medical provider, you will be either: o Tested for (COVID-19) and discharged home on quarantine except to seek medical care if symptoms worsen, and asked to  - Stay home and avoid contact with others until you get your results (4-5 days)  - Avoid travel on public transportation if possible (such as bus, train, or airplane) or o Sent to the Emergency Department by EMS for evaluation, COVID-19 testing, and possible  admission depending on your condition and test results.  What to do if you are LOW RISK for COVID-19?  Reduce your risk of any infection by using the same precautions used for avoiding the common cold or flu:  . Wash your hands often with soap and warm water for at least 20 seconds.  If soap and water are not readily available, use an alcohol-based hand sanitizer with at least 60% alcohol.  . If coughing or sneezing, cover your mouth and nose by coughing or sneezing into the elbow areas of your shirt or coat, into a tissue or into your sleeve (not your hands). . Avoid shaking hands with others and consider head nods or verbal greetings only. . Avoid touching your eyes, nose, or mouth with unwashed hands.  . Avoid close contact with people who are sick. . Avoid places or events with large numbers of people in one location, like concerts or sporting events. . Carefully consider travel plans you have or are making. . If you are planning any travel outside or inside the [place], visit the CDC's Travelers' Health webpage for the latest health notices. . If you have some symptoms but not all symptoms, continue to monitor at home and seek medical attention if your symptoms worsen. . If you are having a medical emergency, call 911.   ADDITIONAL HEALTHCARE OPTIONS FOR PATIENTS  [place] Tele[facility] / e-Visit: [email or web address]         [facility]: [phone]  [place]   [facility]: [phone]                   [facility]: [phone]   Prescreened. Neg .cm 

## 2018-09-11 ENCOUNTER — Ambulatory Visit (INDEPENDENT_AMBULATORY_CARE_PROVIDER_SITE_OTHER): Payer: BLUE CROSS/BLUE SHIELD | Admitting: Obstetrics and Gynecology

## 2018-09-11 ENCOUNTER — Other Ambulatory Visit: Payer: Self-pay

## 2018-09-11 ENCOUNTER — Ambulatory Visit (INDEPENDENT_AMBULATORY_CARE_PROVIDER_SITE_OTHER): Payer: BLUE CROSS/BLUE SHIELD

## 2018-09-11 VITALS — BP 110/83 | HR 94 | Wt 210.2 lb

## 2018-09-11 DIAGNOSIS — Z363 Encounter for antenatal screening for malformations: Secondary | ICD-10-CM

## 2018-09-11 DIAGNOSIS — N926 Irregular menstruation, unspecified: Secondary | ICD-10-CM

## 2018-09-11 DIAGNOSIS — Z3492 Encounter for supervision of normal pregnancy, unspecified, second trimester: Secondary | ICD-10-CM

## 2018-09-11 DIAGNOSIS — Z3A20 20 weeks gestation of pregnancy: Secondary | ICD-10-CM

## 2018-09-11 NOTE — Progress Notes (Signed)
[name] & anatomy scan: Reviewed anatomy scan below:  Date of Service: 09/11/2018   Indications:Anatomy Ultrasound Findings:  Singleton intrauterine pregnancy is visualized with FHR at 153 BPM. Biometrics give an (U/S) Gestational age of [redacted]w[redacted]d and an (U/S) EDD of 01/23/2019; this correlates with the clinically established Estimated Date of Delivery: 01/29/19  Fetal presentation is transverse.  EFW: 370 g ( 13 oz). Placenta: posterior. Grade: 1 AFI: subjectively normal.  Anatomic survey is complete and normal; Gender - female.    Right Ovary is normal in appearance. Left Ovary is normal appearance. Survey of the adnexa demonstrates no adnexal masses. There is no free peritoneal fluid in the cul de sac.  Impression: 1. [redacted]w[redacted]d Viable Singleton Intrauterine pregnancy by U/S. 2. (U/S) EDD is consistent with Clinically established Estimated Date of Delivery: 01/29/19 . 3. Normal Anatomy Scan

## 2018-10-09 ENCOUNTER — Other Ambulatory Visit: Payer: Self-pay

## 2018-10-09 ENCOUNTER — Ambulatory Visit (INDEPENDENT_AMBULATORY_CARE_PROVIDER_SITE_OTHER): Payer: BC Managed Care – PPO | Admitting: Certified Nurse Midwife

## 2018-10-09 VITALS — BP 109/71 | HR 77 | Wt 218.6 lb

## 2018-10-09 DIAGNOSIS — Z3492 Encounter for supervision of normal pregnancy, unspecified, second trimester: Secondary | ICD-10-CM

## 2018-10-09 DIAGNOSIS — Z131 Encounter for screening for diabetes mellitus: Secondary | ICD-10-CM

## 2018-10-09 DIAGNOSIS — Z130 Encounter for screening for diseases of the blood and blood-forming organs and certain disorders involving the immune mechanism: Secondary | ICD-10-CM

## 2018-10-09 DIAGNOSIS — O99612 Diseases of the digestive system complicating pregnancy, second trimester: Secondary | ICD-10-CM

## 2018-10-09 DIAGNOSIS — Z113 Encounter for screening for infections with a predominantly sexual mode of transmission: Secondary | ICD-10-CM

## 2018-10-09 DIAGNOSIS — K219 Gastro-esophageal reflux disease without esophagitis: Secondary | ICD-10-CM

## 2018-10-09 LAB — POCT URINALYSIS DIPSTICK OB
Bilirubin, UA: NEGATIVE
Blood, UA: NEGATIVE
Glucose, UA: NEGATIVE
Ketones, UA: NEGATIVE
Leukocytes, UA: NEGATIVE
Nitrite, UA: NEGATIVE
POC,PROTEIN,UA: NEGATIVE
Spec Grav, UA: 1.015 (ref 1.010–1.025)
Urobilinogen, UA: 0.2 E.U./dL
pH, UA: 6.5 (ref 5.0–8.0)

## 2018-10-09 MED ORDER — PANTOPRAZOLE SODIUM 20 MG PO TBEC: 20.0000 mg | DELAYED_RELEASE_TABLET | Freq: Two times a day (BID) | ORAL | 1 refills | Status: DC

## 2018-10-09 NOTE — Progress Notes (Signed)
[name]-Reports reflux in pregnancy, questions how to stop eating all the time. Discussed home treatment measures. Rx Protonix, see orders. Encouraged to increase protein intake. Anticipatory guidance regarding course of prenatal care. Reviewed red flag symptoms and when to call. RTC x 4 wks for TDaP/BTC, 28 wk labs, and [name] or sooner if needed

## 2018-10-09 NOTE — Progress Notes (Signed)
[name]-Patient c/o heart burn, takes TUMS or Pepcid with no relief.

## 2018-10-09 NOTE — Patient Instructions (Addendum)
Heartburn During Pregnancy ° °Heartburn is pain or discomfort in the throat or chest. It may cause a burning feeling. It happens when stomach acid moves up into the tube that carries food from your mouth to your stomach (esophagus). Heartburn is common during pregnancy. It usually goes away or gets better after giving birth. °Follow these instructions at home: °Eating and drinking °· Do not drink alcohol while you are pregnant. °· Figure out which foods and beverages make you feel worse, and avoid them. °· Beverages that you may want to avoid include: °? Coffee and tea (with or without caffeine). °? Energy drinks and sports drinks. °? Bubbly (carbonated) drinks or sodas. °? Citrus fruit juices. °· Foods that you may want to avoid include: °? Chocolate and cocoa. °? Peppermint and mint flavorings. °? Garlic, onions, and horseradish. °? Spicy and acidic foods. These include peppers, chili powder, curry powder, vinegar, hot sauces, and barbecue sauce. °? Citrus fruits, such as oranges, lemons, and limes. °? Tomato-based foods, such as red sauce, chili, and salsa. °? Fried and fatty foods, such as donuts, french fries, potato chips, and high-fat dressings. °? High-fat meats, such as hot dogs, cold cuts, sausage, ham, and bacon. °? High-fat dairy items, such as whole milk, butter, and cheese. °· Eat small meals often, instead of large meals. °· Avoid drinking a lot of liquid with your meals. °· Avoid eating meals during the 2-3 hours before you go to bed. °· Avoid lying down right after you eat. °· Do not exercise right after you eat. °Medicines °· Take over-the-counter and prescription medicines only as told by your doctor. °· Do not take aspirin, ibuprofen, or other NSAIDs unless your doctor tells you to do that. °· Your doctor may tell you to avoid medicines that have sodium bicarbonate in them. °General instructions ° °· If told, raise the head of your bed about 6 inches (15 cm). You can do this by putting blocks  under the legs. Sleeping with more pillows does not help with heartburn. °· Do not use any products that contain nicotine or tobacco, such as cigarettes and e-cigarettes. If you need help quitting, ask your doctor. °· Wear loose-fitting clothing. °· Try to lower your stress, such as with yoga or meditation. If you need help, ask your doctor. °· Stay at a healthy weight. If you are overweight, work with your doctor to safely lose weight. °· Keep all follow-up visits as told by your doctor. This is important. °Contact a doctor if: °· You get new symptoms. °· Your symptoms do not get better with treatment. °· You have weight loss and you do not know why. °· You have trouble swallowing. °· You make loud sounds when you breathe (wheeze). °· You have a cough that does not go away. °· You have heartburn often for more than 2 weeks. °· You feel sick to your stomach (nauseous), and this does not get better with treatment. °· You are throwing up (vomiting), and this does not get better with treatment. °· You have pain in your belly (abdomen). °Get help right away if: °· You have very bad chest pain that spreads to your arm, neck, or jaw. °· You feel sweaty, dizzy, or light-headed. °· You have trouble breathing. °· You have pain when swallowing. °· You throw up and your throw-up looks like blood or coffee grounds. °· Your poop (stool) is bloody or black. °This information is not intended to replace advice given to you by your health   care provider. Make sure you discuss any questions you have with your health care provider. Document Released: 05/07/2010 Document Revised: 12/21/2015 Document Reviewed: 12/21/2015 Elsevier Interactive Patient Education  2019 [employer]  Eating Plan for Pregnant Women While you are pregnant, your body requires additional nutrition to help support your growing baby. You also have a higher need for some vitamins and minerals, such as folic acid, calcium, iron, and vitamin D. Eating a  healthy, well-balanced diet is very important for your health and your baby's health. Your need for extra calories varies for the three 2521-month segments of your pregnancy (trimesters). For most women, it is recommended to consume: 150 extra calories a day during the first trimester. 300 extra calories a day during the second trimester. 300 extra calories a day during the third trimester. What are tips for following this plan?  Do not try to lose weight or go on a diet during pregnancy. Limit your overall intake of foods that have "empty calories." These are foods that have little nutritional value, such as sweets, desserts, candies, and sugar-sweetened beverages. Eat a variety of foods (especially fruits and vegetables) to get a full range of vitamins and minerals. Take a prenatal vitamin to help meet your additional vitamin and mineral needs during pregnancy, specifically for folic acid, iron, calcium, and vitamin D. Remember to stay active. Ask your health care provider what types of exercise and activities are safe for you. Practice good food safety and cleanliness. Wash your hands before you eat and after you prepare raw meat. Wash all fruits and vegetables well before peeling or eating. Taking these actions can help to prevent food-borne illnesses that can be very dangerous to your baby, such as listeriosis. Ask your health care provider for more information about listeriosis. What does 150 extra calories look like? Healthy options that provide 150 extra calories each day could be any of the following: 6-8 oz (170-230 g) of plain low-fat yogurt with  cup of berries. 1 apple with 2 teaspoons (11 g) of peanut butter. Cut-up vegetables with  cup (60 g) of hummus. 8 oz (230 mL) or 1 cup of low-fat chocolate milk. 1 stick of string cheese with 1 medium orange. 1 peanut butter and jelly sandwich that is made with one slice of whole-wheat bread and 1 tsp (5 g) of peanut butter. For 300 extra  calories, you could eat two of those healthy options each day. What is a healthy amount of weight to gain? The right amount of weight gain for you is based on your BMI before you became pregnant. If your BMI: Was less than 18 (underweight), you should gain 28-40 lb (13-18 kg). Was 18-24.9 (normal), you should gain 25-35 lb (11-16 kg). Was 25-29.9 (overweight), you should gain 15-25 lb (7-11 kg). Was 30 or greater (obese), you should gain 11-20 lb (5-9 kg). What if I am having twins or multiples? Generally, if you are carrying twins or multiples: You may need to eat 300-600 extra calories a day. The recommended range for total weight gain is 25-54 lb (11-25 kg), depending on your BMI before pregnancy. Talk with your health care provider to find out about nutritional needs, weight gain, and exercise that is right for you. What foods can I eat?  Grains All grains. Choose whole grains, such as whole-wheat bread, oatmeal, or brown rice. Vegetables All vegetables. Eat a variety of colors and types of vegetables. Remember to wash your vegetables well before peeling or eating. Fruits All fruits.  Eat a variety of colors and types of fruit. Remember to wash your fruits well before peeling or eating. Meats and other protein foods Lean meats, including chicken, [place], fish, and lean cuts of beef, veal, or pork. If you eat fish or seafood, choose options that are higher in omega-3 fatty acids and lower in mercury, such as salmon, herring, mussels, trout, sardines, pollock, shrimp, crab, and lobster. Tofu. Tempeh. Beans. Eggs. Peanut butter and other nut butters. Make sure that all meats, poultry, and eggs are cooked to food-safe temperatures or "well-done." Two or more servings of fish are recommended each week in order to get the most benefits from omega-3 fatty acids that are found in seafood. Choose fish that are lower in mercury. You can find more information online: [employer] Dairy Pasteurized  milk and milk alternatives (such as almond milk). Pasteurized yogurt and pasteurized cheese. Cottage cheese. Sour cream. Beverages Water. Juices that contain 100% fruit juice or vegetable juice. Caffeine-free teas and decaffeinated coffee. Drinks that contain caffeine are okay to drink, but it is better to avoid caffeine. Keep your total caffeine intake to less than 200 mg each day (which is 12 oz or 355 mL of coffee, tea, or soda) or the limit as told by your health care provider. Fats and oils Fats and oils are okay to include in moderation. Sweets and desserts Sweets and desserts are okay to include in moderation. Seasoning and other foods All pasteurized condiments. The items listed above may not be a complete list of recommended foods and beverages. Contact your dietitian for more options. What foods are not recommended? Vegetables Raw (unpasteurized) vegetable juices. Fruits Unpasteurized fruit juices. Meats and other protein foods Lunch meats, bologna, hot dogs, or other deli meats. (If you must eat those meats, reheat them until they are steaming hot.) Refrigerated pat, meat spreads from a meat counter, smoked seafood that is found in the refrigerated section of a store. Raw or undercooked meats, poultry, and eggs. Raw fish, such as sushi or sashimi. Fish that have high mercury content, such as tilefish, shark, swordfish, and king mackerel. To learn more about mercury in fish, talk with your health care provider or look for online resources, such as: [employer] Dairy Raw (unpasteurized) milk and any foods that have raw milk in them. Soft cheeses, such as feta, queso blanco, queso fresco, Brie, Camembert cheeses, blue-veined cheeses, and Panela cheese (unless it is made with pasteurized milk, which must be stated on the label). Beverages Alcohol. Sugar-sweetened beverages, such as sodas, teas, or energy drinks. Seasoning and other foods Homemade fermented foods and drinks, such as  pickles, sauerkraut, or kombucha drinks. (Store-bought pasteurized versions of these are okay.) Salads that are made in a store or deli, such as ham salad, chicken salad, egg salad, tuna salad, and seafood salad. The items listed above may not be a complete list of foods and beverages to avoid. Contact your dietitian for more information. Where to find more information To calculate the number of calories you need based on your height, weight, and activity level, you can use an online calculator such as: MobileTransition.ch To calculate how much weight you should gain during pregnancy, you can use an online pregnancy weight gain calculator such as: Stream[employer]cy Summary While you are pregnant, your body requires additional nutrition to help support your growing baby. Eat a variety of foods, especially fruits and vegetables to get a full range of vitamins and minerals. Practice good food safety and cleanliness. Wash your hands before  you eat and after you prepare raw meat. Wash all fruits and vegetables well before peeling or eating. Taking these actions can help to prevent food-borne illnesses, such as listeriosis, that can be very dangerous to your baby. Do not eat raw meat or fish. Do not eat fish that have high mercury content, such as tilefish, shark, swordfish, and king mackerel. Do not eat unpasteurized (raw) dairy. Take a prenatal vitamin to help meet your additional vitamin and mineral needs during pregnancy, specifically for folic acid, iron, calcium, and vitamin D. This information is not intended to replace advice given to you by your health care provider. Make sure you discuss any questions you have with your health care provider. Document Released: 01/17/2014 Document Revised: 12/30/2016 Document Reviewed: 12/30/2016 Elsevier Interactive Patient Education  2019 [employer]

## 2018-10-10 ENCOUNTER — Other Ambulatory Visit: Payer: Self-pay

## 2018-10-10 ENCOUNTER — Observation Stay
Admission: EM | Admit: 2018-10-10 | Discharge: 2018-10-10 | Disposition: A | Discharge status: Home / Self Care | Payer: BC Managed Care – PPO | Attending: Certified Nurse Midwife | Admitting: Certified Nurse Midwife

## 2018-10-10 ENCOUNTER — Other Ambulatory Visit: Payer: Self-pay | Admitting: Certified Nurse Midwife

## 2018-10-10 DIAGNOSIS — Z3A24 24 weeks gestation of pregnancy: Secondary | ICD-10-CM

## 2018-10-10 DIAGNOSIS — O26899 Other specified pregnancy related conditions, unspecified trimester: Secondary | ICD-10-CM

## 2018-10-10 DIAGNOSIS — K219 Gastro-esophageal reflux disease without esophagitis: Secondary | ICD-10-CM | POA: Diagnosis not present

## 2018-10-10 DIAGNOSIS — O26892 Other specified pregnancy related conditions, second trimester: Secondary | ICD-10-CM | POA: Diagnosis not present

## 2018-10-10 DIAGNOSIS — O99212 Obesity complicating pregnancy, second trimester: Secondary | ICD-10-CM | POA: Insufficient documentation

## 2018-10-10 DIAGNOSIS — R1031 Right lower quadrant pain: Secondary | ICD-10-CM | POA: Diagnosis not present

## 2018-10-10 DIAGNOSIS — O99612 Diseases of the digestive system complicating pregnancy, second trimester: Secondary | ICD-10-CM | POA: Insufficient documentation

## 2018-10-10 HISTORY — DX: Depression, unspecified: F32.A

## 2018-10-10 HISTORY — DX: Anxiety disorder, unspecified: F41.9

## 2018-10-10 LAB — WET PREP, GENITAL
Clue Cells Wet Prep HPF POC: NONE SEEN
Trich, Wet Prep: NONE SEEN
WBC, Wet Prep HPF POC: NONE SEEN
Yeast Wet Prep HPF POC: NONE SEEN

## 2018-10-10 MED ORDER — ACETAMINOPHEN 500 MG PO TABS
1000.0000 mg | ORAL_TABLET | Freq: Four times a day (QID) | ORAL | Status: DC | PRN
  Administered 2018-10-10: 10:00:00 1000 mg via ORAL
  Filled 2018-10-10: qty 2

## 2018-10-10 MED ORDER — HYDROXYZINE HCL 25 MG PO TABS
25.0000 mg | ORAL_TABLET | Freq: Four times a day (QID) | ORAL | Status: DC | PRN
  Administered 2018-10-10: 25 mg via ORAL
  Filled 2018-10-10 (×3): qty 1

## 2018-10-10 MED ORDER — HYDROXYZINE HCL 25 MG PO TABS: 25.0000 mg | ORAL_TABLET | Freq: Four times a day (QID) | ORAL | 0 refills | Status: DC | PRN

## 2018-10-10 MED ORDER — ACETAMINOPHEN 500 MG PO TABS: 1000.0000 mg | ORAL_TABLET | Freq: Four times a day (QID) | ORAL | 0 refills | Status: DC | PRN

## 2018-10-10 NOTE — Discharge Instructions (Signed)
Sciatica  Sciatica is pain, numbness, weakness, or tingling along your sciatic nerve. The sciatic nerve starts in the lower back and goes down the back of each leg. Sciatica happens when this nerve is pinched or has pressure put on it. Sciatica usually goes away on its own or with treatment. Sometimes, sciatica may keep coming back (recur). Follow these instructions at home: Medicines  Take over-the-counter and prescription medicines only as told by your doctor.  Do not drive or use heavy machinery while taking prescription pain medicine. Managing pain  If directed, put ice on the affected area. ? Put ice in a plastic bag. ? Place a towel between your skin and the bag. ? Leave the ice on for 20 minutes, 2-3 times a day.  After icing, apply heat to the affected area before you exercise or as often as told by your doctor. Use the heat source that your doctor tells you to use, such as a moist heat pack or a heating pad. ? Place a towel between your skin and the heat source. ? Leave the heat on for 20-30 minutes. ? Remove the heat if your skin turns bright red. This is especially important if you are unable to feel pain, heat, or cold. You may have a greater risk of getting burned. Activity  Return to your normal activities as told by your doctor. Ask your doctor what activities are safe for you. ? Avoid activities that make your sciatica worse.  Take short rests during the day. Rest in a lying or standing position. This is usually better than sitting to rest. ? When you rest for a long time, do some physical activity or stretching between periods of rest. ? Avoid sitting for a long time without moving. Get up and move around at least one time each hour.  Exercise and stretch regularly, as told by your doctor.  Do not lift anything that is heavier than 10 lb (4.5 kg) while you have symptoms of sciatica. ? Avoid lifting heavy things even when you do not have symptoms. ? Avoid lifting  heavy things over and over.  When you lift objects, always lift in a way that is safe for your body. To do this, you should: ? Bend your knees. ? Keep the object close to your body. ? Avoid twisting. General instructions  Use good posture. ? Avoid leaning forward when you are sitting. ? Avoid hunching over when you are standing.  Stay at a healthy weight.  Wear comfortable shoes that support your feet. Avoid wearing high heels.  Avoid sleeping on a mattress that is too soft or too hard. You might have less pain if you sleep on a mattress that is firm enough to support your back.  Keep all follow-up visits as told by your doctor. This is important. Contact a doctor if:  You have pain that: ? Wakes you up when you are sleeping. ? Gets worse when you lie down. ? Is worse than the pain you have had in the past. ? Lasts longer than 4 weeks.  You lose weight for without trying. Get help right away if:  You cannot control when you pee (urinate) or poop (have a bowel movement).  You have weakness in any of these areas and it gets worse. ? Lower back. ? Lower belly (pelvis). ? Butt (buttocks). ? Legs.  You have redness or swelling of your back.  You have a burning feeling when you pee. This information is not intended to  replace advice given to you by your health care provider. Make sure you discuss any questions you have with your health care provider. Document Released: 01/12/2008 Document Revised: 09/10/2015 Document Reviewed: 12/12/2014 Elsevier Interactive Patient Education  2019 [employer] Back Pain in Pregnancy Back pain during pregnancy is common. Back pain may be caused by several factors that are related to changes during your pregnancy. Follow these instructions at home: Managing pain, stiffness, and swelling      If directed, for sudden (acute) back pain, put ice on the painful area. ? Put ice in a plastic bag. ? Place a towel between your skin and the  bag. ? Leave the ice on for 20 minutes, 2-3 times per day.  If directed, apply heat to the affected area before you exercise. Use the heat source that your health care provider recommends, such as a moist heat pack or a heating pad. ? Place a towel between your skin and the heat source. ? Leave the heat on for 20-30 minutes. ? Remove the heat if your skin turns bright red. This is especially important if you are unable to feel pain, heat, or cold. You may have a greater risk of getting burned.  If directed, massage the affected area. Activity  Exercise as told by your health care provider. Gentle exercise is the best way to prevent or manage back pain.  Listen to your body when lifting. If lifting hurts, ask for help or bend your knees. This uses your leg muscles instead of your back muscles.  Squat down when picking up something from the floor. Do not bend over.  Only use bed rest for short periods as told by your health care provider. Bed rest should only be used for the most severe episodes of back pain. Standing, sitting, and lying down  Do not stand in one place for long periods of time.  Use good posture when sitting. Make sure your head rests over your shoulders and is not hanging forward. Use a pillow on your lower back if necessary.  Try sleeping on your side, preferably the left side, with a pregnancy support pillow or 1-2 regular pillows between your legs. ? If you have back pain after a night's rest, your bed may be too soft. ? A firm mattress may provide more support for your back during pregnancy. General instructions  Do not wear high heels.  Eat a healthy diet. Try to gain weight within your health care provider's recommendations.  Use a maternity girdle, elastic sling, or back brace as told by your health care provider.  Take over-the-counter and prescription medicines only as told by your health care provider.  Work with a physical therapist or massage therapist  to find ways to manage back pain. Acupuncture or massage therapy may be helpful.  Keep all follow-up visits as told by your health care provider. This is important. Contact a health care provider if:  Your back pain interferes with your daily activities.  You have increasing pain in other parts of your body. Get help right away if:  You develop numbness, tingling, weakness, or problems with the use of your arms or legs.  You develop severe back pain that is not controlled with medicine.  You have a change in bowel or bladder control.  You develop shortness of breath, dizziness, or you faint.  You develop nausea, vomiting, or sweating.  You have back pain that is a rhythmic, cramping pain similar to labor pains. Labor pain is  usually 1-2 minutes apart, lasts for about 1 minute, and involves a bearing down feeling or pressure in your pelvis.  You have back pain and your water breaks or you have vaginal bleeding.  You have back pain or numbness that travels down your leg.  Your back pain developed after you fell.  You develop pain on one side of your back.  You see blood in your urine.  You develop skin blisters in the area of your back pain. Summary  Back pain may be caused by several factors that are related to changes during your pregnancy.  Follow instructions as told by your health care provider for managing pain, stiffness, and swelling.  Exercise as told by your health care provider. Gentle exercise is the best way to prevent or manage back pain.  Take over-the-counter and prescription medicines only as told by your health care provider.  Keep all follow-up visits as told by your health care provider. This is important. This information is not intended to replace advice given to you by your health care provider. Make sure you discuss any questions you have with your health care provider. Document Released: 07/13/2005 Document Revised: 09/20/2017 Document Reviewed:  09/20/2017 Elsevier Interactive Patient Education  2019 [place]. Round Ligament Pain  The round ligament is a cord of muscle and tissue that helps support the uterus. It can become a source of pain during pregnancy if it becomes stretched or twisted as the baby grows. The pain usually begins in the second trimester (13-28 weeks) of pregnancy, and it can come and go until the baby is delivered. It is not a serious problem, and it does not cause harm to the baby. Round ligament pain is usually a short, sharp, and pinching pain, but it can also be a dull, lingering, and aching pain. The pain is felt in the lower side of the abdomen or in the groin. It usually starts deep in the groin and moves up to the outside of the hip area. The pain may occur when you:  Suddenly change position, such as quickly going from a sitting to standing position.  Roll over in bed.  Cough or sneeze.  Do physical activity. Follow these instructions at home:   Watch your condition for any changes.  When the pain starts, relax. Then try any of these methods to help with the pain: ? Sitting down. ? Flexing your knees up to your abdomen. ? Lying on your side with one pillow under your abdomen and another pillow between your legs. ? Sitting in a warm bath for 15-20 minutes or until the pain goes away.  Take over-the-counter and prescription medicines only as told by your health care provider.  Move slowly when you sit down or stand up.  Avoid long walks if they cause pain.  Stop or reduce your physical activities if they cause pain.  Keep all follow-up visits as told by your health care provider. This is important. Contact a health care provider if:  Your pain does not go away with treatment.  You feel pain in your back that you did not have before.  Your medicine is not helping. Get help right away if:  You have a fever or chills.  You develop uterine contractions.  You have vaginal  bleeding.  You have nausea or vomiting.  You have diarrhea.  You have pain when you urinate. Summary  Round ligament pain is felt in the lower abdomen or groin. It is usually a short,  sharp, and pinching pain. It can also be a dull, lingering, and aching pain.  This pain usually begins in the second trimester (13-28 weeks). It occurs because the uterus is stretching with the growing baby, and it is not harmful to the baby.  You may notice the pain when you suddenly change position, when you cough or sneeze, or during physical activity.  Relaxing, flexing your knees to your abdomen, lying on one side, or taking a warm bath may help to get rid of the pain.  Get help from your health care provider if the pain does not go away or if you have vaginal bleeding, nausea, vomiting, diarrhea, or painful urination. This information is not intended to replace advice given to you by your health care provider. Make sure you discuss any questions you have with your health care provider. Document Released: 01/12/2008 Document Revised: 09/20/2017 Document Reviewed: 09/20/2017 Elsevier Interactive Patient Education  2019 [employer] Inc. Abdominal Pain During Pregnancy  Belly (abdominal) pain is common during pregnancy. There are many possible causes. Most of the time, it is not a serious problem. Other times, it can be a sign that something is wrong with the pregnancy. Always tell your doctor if you have belly pain. Follow these instructions at home:  Do not have sex or put anything in your vagina until your pain goes away completely.  Get plenty of rest until your pain gets better.  Drink enough fluid to keep your pee (urine) pale yellow.  Take over-the-counter and prescription medicines only as told by your doctor.  Keep all follow-up visits as told by your doctor. This is important. Contact a doctor if:  Your pain continues or gets worse after resting.  You have lower belly pain that: ? Comes  and goes at regular times. ? Spreads to your back. ? Feels like menstrual cramps.  You have pain or burning when you pee (urinate). Get help right away if:  You have a fever or chills.  You have vaginal bleeding.  You are leaking fluid from your vagina.  You are passing tissue from your vagina.  You throw up (vomit) for more than 24 hours.  You have watery poop (diarrhea) for more than 24 hours.  Your baby is moving less than usual.  You feel very weak or faint.  You have shortness of breath.  You have very bad pain in your upper belly. Summary  Belly (abdominal) pain is common during pregnancy. There are many possible causes.  If you have belly pain during pregnancy, tell your doctor right away.  Keep all follow-up visits as told by your doctor. This is important. This information is not intended to replace advice given to you by your health care provider. Make sure you discuss any questions you have with your health care provider. Document Released: 03/23/2009 Document Revised: 07/07/2016 Document Reviewed: 07/07/2016 Elsevier Interactive Patient Education  2019 [employer]

## 2018-10-10 NOTE — OB Triage Note (Signed)
Pt. Presented to L/D triage with reported RLQ abdominal pain since 0530 6/24. She describes it as intermittent sharp, stabbing pain 4/10 with periods of constant pain unrelieved by rest that radiates down her right leg. No bleeding or LOF. Positive fetal movement. VSS. Will continue to monitor.

## 2018-10-10 NOTE — Discharge Summary (Signed)
  Obstetric Discharge Summary  Patient ID: Isabella Weaver MRN: [id number] DOB/AGE: 06-15-1990 28 y.o.   Date of Admission: 10/10/2018  Date of Discharge:  10/10/18  Admitting Diagnosis: Observation at [redacted]w[redacted]d  Secondary Diagnosis: Obesity, GERD in pregnancy, History of chlamydia in pregnancy, Milk allergy     Discharge Diagnosis: No other diagnosis   Antepartum Procedures: NST, oral hydration, and PO meds    Brief Hospital Course   L&D OB Triage Note  Isabella Weaver is a 28 y.o. G1P0000 female at [redacted]w[redacted]d, EDD Estimated Date of Delivery: 01/29/19 who presented to triage for complaints of intermittent right lower quadrant pain with periods of constant pain that radiates down right leg. Last intercourse yesterday. She was evaluated by the nurses with no significant findings for acute abdomen, preterm labor or fetal distress. Vital signs stable. An NST was performed and has been reviewed by CNM. She was treated with oral tylenol and vistaril.   NST INTERPRETATION:  Indications: rule out uterine contractions  Mode: External Baseline Rate (A): 145 bpm Variability: Moderate Accelerations: 10 x 10 Decelerations: None Contraction Frequency (min): UI  Impression: reactive   Plan: NST performed was reviewed and was found to be reactive. She was discharged home with bleeding/labor precautions.  Continue routine prenatal care. Follow up with CNM as previously scheduled.   Discharge Instructions: Per After Visit Summary.  Activity: Also refer to After Visit Summary.  Diet: Regular  Medications: Allergies as of 10/10/2018      Reactions   Latex       Medication List    TAKE these medications   acetaminophen 500 MG tablet Commonly known as: TYLENOL Take 2 tablets (1,000 mg total) by mouth every 6 (six) hours as needed for fever or headache.   hydrOXYzine 25 MG tablet Commonly known as: ATARAX/VISTARIL Take 1 tablet (25 mg total) by mouth every 6 (six) hours as needed  (contractions).   multivitamin-prenatal 27-0.8 MG Tabs tablet Take 1 tablet by mouth daily at 12 noon.   pantoprazole 20 MG tablet Commonly known as: Protonix Take 1 tablet (20 mg total) by mouth 2 (two) times daily.      Outpatient follow up:  Follow-up Information    [facility] Follow up.   Why: Follow up as previously scheduled or sooner if needed Contact information: [address] Tryson Lumley, CNM [facility], [facility] 10/10/18 1:07 PM

## 2018-10-10 NOTE — Progress Notes (Signed)
Last intercourse: 10/09/18.

## 2018-11-06 ENCOUNTER — Other Ambulatory Visit: Payer: Self-pay

## 2018-11-06 ENCOUNTER — Other Ambulatory Visit: Payer: BC Managed Care – PPO

## 2018-11-06 ENCOUNTER — Encounter: Payer: Self-pay | Admitting: Certified Nurse Midwife

## 2018-11-06 ENCOUNTER — Ambulatory Visit (INDEPENDENT_AMBULATORY_CARE_PROVIDER_SITE_OTHER): Payer: BC Managed Care – PPO | Admitting: Certified Nurse Midwife

## 2018-11-06 VITALS — BP 107/80 | HR 95 | Wt 219.5 lb

## 2018-11-06 DIAGNOSIS — Z113 Encounter for screening for infections with a predominantly sexual mode of transmission: Secondary | ICD-10-CM

## 2018-11-06 DIAGNOSIS — Z23 Encounter for immunization: Secondary | ICD-10-CM

## 2018-11-06 DIAGNOSIS — Z130 Encounter for screening for diseases of the blood and blood-forming organs and certain disorders involving the immune mechanism: Secondary | ICD-10-CM

## 2018-11-06 DIAGNOSIS — Z131 Encounter for screening for diabetes mellitus: Secondary | ICD-10-CM

## 2018-11-06 DIAGNOSIS — Z3492 Encounter for supervision of normal pregnancy, unspecified, second trimester: Secondary | ICD-10-CM

## 2018-11-06 LAB — POCT URINALYSIS DIPSTICK OB
Bilirubin, UA: NEGATIVE
Blood, UA: NEGATIVE
Glucose, UA: NEGATIVE
Ketones, UA: NEGATIVE
Leukocytes, UA: NEGATIVE
Nitrite, UA: NEGATIVE
POC,PROTEIN,UA: NEGATIVE
Spec Grav, UA: 1.015 (ref 1.010–1.025)
Urobilinogen, UA: 0.2 E.U./dL
pH, UA: 7 (ref 5.0–8.0)

## 2018-11-06 MED ORDER — TETANUS-DIPHTH-ACELL PERTUSSIS 5-2.5-18.5 LF-MCG/0.5 IM SUSP
0.5000 mL | Freq: Once | INTRAMUSCULAR | Status: AC
  Administered 2018-11-06: 09:00:00 0.5 mL via INTRAMUSCULAR

## 2018-11-06 NOTE — Patient Instructions (Signed)
Td Vaccine (Tetanus and Diphtheria): What You Need to Know 1. Why get vaccinated? Tetanus  and diphtheria are very serious diseases. They are rare in the [place] today, but people who do become infected often have severe complications. Td vaccine is used to protect adolescents and adults from both of these diseases. Both tetanus and diphtheria are infections caused by bacteria. Diphtheria spreads from person to person through coughing or sneezing. Tetanus-causing bacteria enter the body through cuts, scratches, or wounds. TETANUS (Lockjaw) causes painful muscle tightening and stiffness, usually all over the body.  It can lead to tightening of muscles in the head and neck so you can't open your mouth, swallow, or sometimes even breathe. Tetanus kills about 1 out of every 10 people who are infected even after receiving the best medical care. DIPHTHERIA can cause a thick coating to form in the back of the throat.  It can lead to breathing problems, paralysis, heart failure, and death. Before vaccines, as many as 200,000 cases of diphtheria and hundreds of cases of tetanus were reported in the [place] each year. Since vaccination began, reports of cases for both diseases have dropped by about 99%. 2. Td vaccine Td vaccine can protect adolescents and adults from tetanus and diphtheria. Td is usually given as a booster dose every 10 years but it can also be given earlier after a severe and dirty wound or burn. Another vaccine, called Tdap, which protects against pertussis in addition to tetanus and diphtheria, is sometimes recommended instead of Td vaccine. Your doctor or the person giving you the vaccine can give you more information. Td may safely be given at the same time as other vaccines. 3. Some people should not get this vaccine  A person who has ever had a life-threatening allergic reaction after a previous dose of any tetanus or diphtheria containing vaccine, OR has a severe allergy  to any part of this vaccine, should not get Td vaccine. Tell the person giving the vaccine about any severe allergies.  Talk to your doctor if you: ? had severe pain or swelling after any vaccine containing diphtheria or tetanus, ? ever had a condition called Guillain Barr Syndrome (GBS), ? aren't feeling well on the day the shot is scheduled. 4. Risks of a vaccine reaction With any medicine, including vaccines, there is a chance of side effects. These are usually mild and go away on their own. Serious reactions are also possible but are rare. Most people who get Td vaccine do not have any problems with it. Mild Problems following Td vaccine: (Did not interfere with activities)  Pain where the shot was given (about 8 people in 10)  Redness or swelling where the shot was given (about 1 person in 4)  Mild fever (rare)  Headache (about 1 person in 4)  Tiredness (about 1 person in 4) Moderate Problems following Td vaccine: (Interfered with activities, but did not require medical attention)  Fever over 102F (rare) Severe Problems following Td vaccine: (Unable to perform usual activities; required medical attention)  Swelling, severe pain, bleeding and/or redness in the arm where the shot was given (rare). Problems that could happen after any vaccine:  People sometimes faint after a medical procedure, including vaccination. Sitting or lying down for about 15 minutes can help prevent fainting, and injuries caused by a fall. Tell your doctor if you feel dizzy, or have vision changes or ringing in the ears.  Some people get severe pain in the shoulder and have   difficulty moving the arm where a shot was given. This happens very rarely.  Any medication can cause a severe allergic reaction. Such reactions from a vaccine are very rare, estimated at fewer than 1 in a million doses, and would happen within a few minutes to a few hours after the vaccination. As with any medicine, there is a  very remote chance of a vaccine causing a serious injury or death. The safety of vaccines is always being monitored. For more information, visit: www.cdc.gov/vaccinesafety/ 5. What if there is a serious reaction? What should I look for?  Look for anything that concerns you, such as signs of a severe allergic reaction, very high fever, or unusual behavior. Signs of a severe allergic reaction can include hives, swelling of the face and throat, difficulty breathing, a fast heartbeat, dizziness, and weakness. These would usually start a few minutes to a few hours after the vaccination. What should I do?  If you think it is a severe allergic reaction or other emergency that can't wait, call 9-1-1 or get the person to the nearest hospital. Otherwise, call your doctor.  Afterward, the reaction should be reported to the Vaccine Adverse Event Reporting System (VAERS). Your doctor might file this report, or you can do it yourself through the VAERS web site at www.vaers.hhs.gov, or by calling 1-800-822-7967. VAERS does not give medical advice. 6. The National Vaccine Injury Compensation Program The National Vaccine Injury Compensation Program (VICP) is a federal program that was created to compensate people who may have been injured by certain vaccines. Persons who believe they may have been injured by a vaccine can learn about the program and about filing a claim by calling 1-800-338-2382 or visiting the VICP website at www.hrsa.gov/vaccinecompensation. There is a time limit to file a claim for compensation. 7. How can I learn more?  Ask your doctor. He or she can give you the vaccine package insert or suggest other sources of information.  Call your local or state health department.  Contact the Centers for Disease Control and Prevention (CDC): ? Call 1-800-232-4636 (1-800-CDC-INFO) ? Visit CDC's website at www.cdc.gov/vaccines Vaccine Information Statement Td Vaccine (07/28/15) This information is  not intended to replace advice given to you by your health care provider. Make sure you discuss any questions you have with your health care provider. Document Released: 01/30/2006 Document Revised: 11/20/2017 Document Reviewed: 11/20/2017 Elsevier Interactive Patient Education  2020 Elsevier Inc.  

## 2018-11-06 NOTE — Progress Notes (Signed)
[name] doing well. Feels good movement. Disscussed BC after baby and birth plan. Information given. Tdap/BTC/CBC/Glucose today. Will follow up with results. Return in 2 wks.   Philip Aspen, CNM

## 2018-11-07 LAB — CBC
Hematocrit: 35.8 % (ref 34.0–46.6)
Hemoglobin: 12.2 g/dL (ref 11.1–15.9)
MCH: 32.4 pg (ref 26.6–33.0)
MCHC: 34.1 g/dL (ref 31.5–35.7)
MCV: 95 fL (ref 79–97)
Platelets: 293 10*3/uL (ref 150–450)
RBC: 3.77 x10E6/uL (ref 3.77–5.28)
RDW: 11.7 % (ref 11.7–15.4)
WBC: 11.2 10*3/uL — ABNORMAL HIGH (ref 3.4–10.8)

## 2018-11-07 LAB — RPR: RPR Ser Ql: NONREACTIVE

## 2018-11-07 LAB — GLUCOSE, 1 HOUR GESTATIONAL: Gestational Diabetes Screen: 137 mg/dL (ref 65–139)

## 2018-11-21 ENCOUNTER — Telehealth: Payer: Self-pay

## 2018-11-21 NOTE — Telephone Encounter (Signed)
Coronavirus (COVID-19) Are you at risk?  Are you at risk for the Coronavirus (COVID-19)?  To be considered HIGH RISK for Coronavirus (COVID-19), you have to meet the following criteria:  . Traveled to [place], [place], [place], [place] or [place]; or in the [place] to [place], [place], [place], or [place]; and have fever, cough, and shortness of breath within the last 2 weeks of travel OR . Been in close contact with a person diagnosed with COVID-19 within the last 2 weeks and have fever, cough, and shortness of breath . IF YOU DO NOT MEET THESE CRITERIA, YOU ARE CONSIDERED LOW RISK FOR COVID-19.  What to do if you are HIGH RISK for COVID-19?  . If you are having a medical emergency, call 911. . Seek medical care right away. Before you go to a doctor's office, urgent care or emergency department, call ahead and tell them about your recent travel, contact with someone diagnosed with COVID-19, and your symptoms. You should receive instructions from your physician's office regarding next steps of care.  . When you arrive at healthcare provider, tell the healthcare staff immediately you have returned from visiting [place], [place], [place], [place] or [place]; or traveled in the [place] to [place], [place], [place], or [place]; in the last two weeks or you have been in close contact with a person diagnosed with COVID-19 in the last 2 weeks.   . Tell the health care staff about your symptoms: fever, cough and shortness of breath. . After you have been seen by a medical provider, you will be either: o Tested for (COVID-19) and discharged home on quarantine except to seek medical care if symptoms worsen, and asked to  - Stay home and avoid contact with others until you get your results (4-5 days)  - Avoid travel on public transportation if possible (such as bus, train, or airplane) or o Sent to the Emergency Department by EMS for evaluation, COVID-19 testing, and possible  admission depending on your condition and test results.  What to do if you are LOW RISK for COVID-19?  Reduce your risk of any infection by using the same precautions used for avoiding the common cold or flu:  . Wash your hands often with soap and warm water for at least 20 seconds.  If soap and water are not readily available, use an alcohol-based hand sanitizer with at least 60% alcohol.  . If coughing or sneezing, cover your mouth and nose by coughing or sneezing into the elbow areas of your shirt or coat, into a tissue or into your sleeve (not your hands). . Avoid shaking hands with others and consider head nods or verbal greetings only. . Avoid touching your eyes, nose, or mouth with unwashed hands.  . Avoid close contact with people who are Isabella Weaver. . Avoid places or events with large numbers of people in one location, like concerts or sporting events. . Carefully consider travel plans you have or are making. . If you are planning any travel outside or inside the [place], visit the CDC's Travelers' Health webpage for the latest health notices. . If you have some symptoms but not all symptoms, continue to monitor at home and seek medical attention if your symptoms worsen. . If you are having a medical emergency, call 911.  11/21/18 SCREENING NEG SLS ADDITIONAL HEALTHCARE OPTIONS FOR PATIENTS  Isabella Weaver Telehealth / e-Visit: [email or web address]         [facility]: [phone]    [facility]: [phone]                   [facility]: [phone]  

## 2018-11-22 ENCOUNTER — Ambulatory Visit (INDEPENDENT_AMBULATORY_CARE_PROVIDER_SITE_OTHER): Payer: BC Managed Care – PPO | Admitting: Certified Nurse Midwife

## 2018-11-22 ENCOUNTER — Other Ambulatory Visit: Payer: Self-pay

## 2018-11-22 VITALS — BP 116/79 | HR 73 | Wt 226.9 lb

## 2018-11-22 DIAGNOSIS — Z3493 Encounter for supervision of normal pregnancy, unspecified, third trimester: Secondary | ICD-10-CM

## 2018-11-22 LAB — POCT URINALYSIS DIPSTICK OB
Bilirubin, UA: NEGATIVE
Blood, UA: NEGATIVE
Glucose, UA: NEGATIVE
Ketones, UA: NEGATIVE
Leukocytes, UA: NEGATIVE
Nitrite, UA: NEGATIVE
POC,PROTEIN,UA: NEGATIVE
Spec Grav, UA: 1.01 (ref 1.010–1.025)
Urobilinogen, UA: 0.2 E.U./dL
pH, UA: 6 (ref 5.0–8.0)

## 2018-11-22 NOTE — Patient Instructions (Signed)
[facility]   [facility]  [address], [place], [place] [address]  Phone: [phone]   [facility] (second location)  [address] [place], [place] [address]  Phone: [phone]   [facility] ([place]) [address], [place], [place] [address] Phone: [phone]   [facility]  [address] ,  [address]  Phone: [phone]  Fetal Movement Counts Patient Name: ________________________________________________ Patient Due Date: ____________________ What is a fetal movement count?  A fetal movement count is the number of times that you feel your baby move during a certain amount of time. This may also be called a fetal kick count. A fetal movement count is recommended for every pregnant woman. You may be asked to start counting fetal movements as early as week 28 of your pregnancy. Pay attention to when your baby is most active. You may notice your baby's sleep and wake cycles. You may also notice things that make your baby move more. You should do a fetal movement count:  When your baby is normally most active.  At the same time each day. A good time to count movements is while you are resting, after having something to eat and drink. How do I count fetal movements? 1. Find a quiet, comfortable area. Sit, or lie down on your side. 2. Write down the date, the start time and stop time, and the number of movements that you felt between those two times. Take this information with you to your health care visits. 3. For 2 hours, count kicks, flutters, swishes, rolls, and jabs. You should feel at least 10 movements during 2 hours. 4. You may stop counting after you have felt 10 movements. 5. If you do not feel 10 movements in 2 hours, have something to eat and drink. Then, keep resting and counting for 1 hour. If you feel at least 4 movements during that hour, you may stop counting. Contact a  health care provider if:  You feel fewer than 4 movements in 2 hours.  Your baby is not moving like he or she usually does. Date: ____________ Start time: ____________ Stop time: ____________ Movements: ____________ Date: ____________ Start time: ____________ Stop time: ____________ Movements: ____________ Date: ____________ Start time: ____________ Stop time: ____________ Movements: ____________ Date: ____________ Start time: ____________ Stop time: ____________ Movements: ____________ Date: ____________ Start time: ____________ Stop time: ____________ Movements: ____________ Date: ____________ Start time: ____________ Stop time: ____________ Movements: ____________ Date: ____________ Start time: ____________ Stop time: ____________ Movements: ____________ Date: ____________ Start time: ____________ Stop time: ____________ Movements: ____________ Date: ____________ Start time: ____________ Stop time: ____________ Movements: ____________ This information is not intended to replace advice given to you by your health care provider. Make sure you discuss any questions you have with your health care provider. Document Released: 05/04/2006 Document Revised: 04/24/2018 Document Reviewed: 05/14/2015 Elsevier Patient Education  2020 Elsevier Inc.  

## 2018-11-22 NOTE — Progress Notes (Signed)
[name]-No complaints.  

## 2018-11-22 NOTE — Progress Notes (Signed)
[name]-Doing well. No questions or concerns. Plans on enrolling in classes. Using [employer]. Anticipatory guidance regarding course of prenatal care. Reviewed red flag symptoms and when to call. RTC x 2 weeks for [name] or sooner if needed.

## 2018-12-07 ENCOUNTER — Ambulatory Visit (INDEPENDENT_AMBULATORY_CARE_PROVIDER_SITE_OTHER): Payer: BC Managed Care – PPO | Admitting: Certified Nurse Midwife

## 2018-12-07 ENCOUNTER — Encounter: Payer: Self-pay | Admitting: Certified Nurse Midwife

## 2018-12-07 ENCOUNTER — Other Ambulatory Visit: Payer: Self-pay

## 2018-12-07 VITALS — BP 118/80 | HR 72 | Wt 229.6 lb

## 2018-12-07 DIAGNOSIS — Z3493 Encounter for supervision of normal pregnancy, unspecified, third trimester: Secondary | ICD-10-CM

## 2018-12-07 MED ORDER — PANTOPRAZOLE SODIUM 20 MG PO TBEC: 20.0000 mg | DELAYED_RELEASE_TABLET | Freq: Every day | ORAL | 6 refills | Status: DC

## 2018-12-07 NOTE — Patient Instructions (Signed)
How a Baby Grows During Pregnancy  Pregnancy begins when a female's sperm enters a female's egg (fertilization). Fertilization usually happens in one of the tubes (fallopian tubes) that connect the ovaries to the womb (uterus). The fertilized egg moves down the fallopian tube to the uterus. Once it reaches the uterus, it implants into the lining of the uterus and begins to grow. For the first 10 weeks, the fertilized egg is called an embryo. After 10 weeks, it is called a fetus. As the fetus continues to grow, it receives oxygen and nutrients through tissue (placenta) that grows to support the developing baby. The placenta is the life support system for the baby. It provides oxygen and nutrition and removes waste. Learning as much as you can about your pregnancy and how your baby is developing can help you enjoy the experience. It can also make you aware of when there might be a problem and when to ask questions. How long does a typical pregnancy last? A pregnancy usually lasts 280 days, or about 40 weeks. Pregnancy is divided into three periods of growth, also called trimesters:  First trimester: 0-12 weeks.  Second trimester: 13-27 weeks.  Third trimester: 28-40 weeks. The day when your baby is ready to be born (full term) is your estimated date of delivery. How does my baby develop month by month? First month  The fertilized egg attaches to the inside of the uterus.  Some cells will form the placenta. Others will form the fetus.  The arms, legs, brain, spinal cord, lungs, and heart begin to develop.  At the end of the first month, the heart begins to beat. Second month  The bones, inner ear, eyelids, hands, and feet form.  The genitals develop.  By the end of 8 weeks, all major organs are developing. Third month  All of the internal organs are forming.  Teeth develop below the gums.  Bones and muscles begin to grow. The spine can flex.  The skin is transparent.  Fingernails  and toenails begin to form.  Arms and legs continue to grow longer, and hands and feet develop.  The fetus is about 3 inches (7.6 cm) long. Fourth month  The placenta is completely formed.  The external sex organs, neck, outer ear, eyebrows, eyelids, and fingernails are formed.  The fetus can hear, swallow, and move its arms and legs.  The kidneys begin to produce urine.  The skin is covered with a white, waxy coating (vernix) and very fine hair (lanugo). Fifth month  The fetus moves around more and can be felt for the first time (quickening).  The fetus starts to sleep and wake up and may begin to suck its finger.  The nails grow to the end of the fingers.  The organ in the digestive system that makes bile (gallbladder) functions and helps to digest nutrients.  If your baby is a girl, eggs are present in her ovaries. If your baby is a boy, testicles start to move down into his scrotum. Sixth month  The lungs are formed.  The eyes open. The brain continues to develop.  Your baby has fingerprints and toe prints. Your baby's hair grows thicker.  At the end of the second trimester, the fetus is about 9 inches (22.9 cm) long. Seventh month  The fetus kicks and stretches.  The eyes are developed enough to sense changes in light.  The hands can make a grasping motion.  The fetus responds to sound. Eighth month  All   organs and body systems are fully developed and functioning.  Bones harden, and taste buds develop. The fetus may hiccup.  Certain areas of the brain are still developing. The skull remains soft. Ninth month  The fetus gains about  lb (0.23 kg) each week.  The lungs are fully developed.  Patterns of sleep develop.  The fetus's head typically moves into a head-down position (vertex) in the uterus to prepare for birth.  The fetus weighs 6-9 lb (2.72-4.08 kg) and is 19-20 inches (48.26-50.8 cm) long. What can I do to have a healthy pregnancy and help  my baby develop? General instructions  Take prenatal vitamins as directed by your health care provider. These include vitamins such as folic acid, iron, calcium, and vitamin D. They are important for healthy development.  Take medicines only as directed by your health care provider. Read labels and ask a pharmacist or your health care provider whether over-the-counter medicines, supplements, and prescription drugs are safe to take during pregnancy.  Keep all follow-up visits as directed by your health care provider. This is important. Follow-up visits include prenatal care and screening tests. How do I know if my baby is developing well? At each prenatal visit, your health care provider will do several different tests to check on your health and keep track of your baby's development. These include:  Fundal height and position. ? Your health care provider will measure your growing belly from your pubic bone to the top of the uterus using a tape measure. ? Your health care provider will also feel your belly to determine your baby's position.  Heartbeat. ? An ultrasound in the first trimester can confirm pregnancy and show a heartbeat, depending on how far along you are. ? Your health care provider will check your baby's heart rate at every prenatal visit.  Second trimester ultrasound. ? This ultrasound checks your baby's development. It also may show your baby's gender. What should I do if I have concerns about my baby's development? Always talk with your health care provider about any concerns that you may have about your pregnancy and your baby. Summary  A pregnancy usually lasts 280 days, or about 40 weeks. Pregnancy is divided into three periods of growth, also called trimesters.  Your health care provider will monitor your baby's growth and development throughout your pregnancy.  Follow your health care provider's recommendations about taking prenatal vitamins and medicines during  your pregnancy.  Talk with your health care provider if you have any concerns about your pregnancy or your developing baby. This information is not intended to replace advice given to you by your health care provider. Make sure you discuss any questions you have with your health care provider. Document Released: 09/21/2007 Document Revised: 07/26/2018 Document Reviewed: 02/15/2017 Elsevier Patient Education  2020 Elsevier Inc.  

## 2018-12-07 NOTE — Progress Notes (Signed)
Body mass index is 35.95 kg/m. [name] doing well. No complaints. Feels good movement. Pt states she has not signed up for classes yet. Encouraged her to do so. Follow up 2 wks with [place], CNM       Philip Aspen, CNM

## 2018-12-20 ENCOUNTER — Observation Stay
Admission: EM | Admit: 2018-12-20 | Discharge: 2018-12-20 | Disposition: A | Discharge status: Home / Self Care | Payer: BC Managed Care – PPO | Attending: Obstetrics and Gynecology | Admitting: Obstetrics and Gynecology

## 2018-12-20 ENCOUNTER — Ambulatory Visit (INDEPENDENT_AMBULATORY_CARE_PROVIDER_SITE_OTHER): Payer: BC Managed Care – PPO | Admitting: Obstetrics and Gynecology

## 2018-12-20 ENCOUNTER — Other Ambulatory Visit: Payer: Self-pay

## 2018-12-20 VITALS — BP 124/106 | HR 78 | Wt 239.3 lb

## 2018-12-20 DIAGNOSIS — O163 Unspecified maternal hypertension, third trimester: Secondary | ICD-10-CM

## 2018-12-20 DIAGNOSIS — O26893 Other specified pregnancy related conditions, third trimester: Principal | ICD-10-CM | POA: Insufficient documentation

## 2018-12-20 DIAGNOSIS — Z3A34 34 weeks gestation of pregnancy: Secondary | ICD-10-CM | POA: Diagnosis not present

## 2018-12-20 DIAGNOSIS — Z23 Encounter for immunization: Secondary | ICD-10-CM | POA: Diagnosis not present

## 2018-12-20 DIAGNOSIS — R030 Elevated blood-pressure reading, without diagnosis of hypertension: Secondary | ICD-10-CM | POA: Insufficient documentation

## 2018-12-20 DIAGNOSIS — Z3493 Encounter for supervision of normal pregnancy, unspecified, third trimester: Secondary | ICD-10-CM

## 2018-12-20 LAB — COMPREHENSIVE METABOLIC PANEL
ALT: 18 U/L (ref 0–44)
AST: 20 U/L (ref 15–41)
Albumin: 2.9 g/dL — ABNORMAL LOW (ref 3.5–5.0)
Alkaline Phosphatase: 82 U/L (ref 38–126)
Anion gap: 10 (ref 5–15)
BUN: 6 mg/dL (ref 6–20)
CO2: 20 mmol/L — ABNORMAL LOW (ref 22–32)
Calcium: 8.7 mg/dL — ABNORMAL LOW (ref 8.9–10.3)
Chloride: 106 mmol/L (ref 98–111)
Creatinine, Ser: 0.61 mg/dL (ref 0.44–1.00)
GFR calc Af Amer: >60 mL/min (ref >60)
GFR calc non Af Amer: >60 mL/min (ref >60)
Glucose, Bld: 116 mg/dL — ABNORMAL HIGH (ref 70–99)
Potassium: 3.8 mmol/L (ref 3.5–5.1)
Sodium: 136 mmol/L (ref 135–145)
Total Bilirubin: 0.3 mg/dL (ref 0.3–1.2)
Total Protein: 6.1 g/dL — ABNORMAL LOW (ref 6.5–8.1)

## 2018-12-20 LAB — POCT URINALYSIS DIPSTICK OB
Bilirubin, UA: NEGATIVE
Bilirubin, UA: NEGATIVE
Blood, UA: NEGATIVE
Blood, UA: NEGATIVE
Glucose, UA: NEGATIVE
Glucose, UA: NEGATIVE
Ketones, UA: NEGATIVE
Ketones, UA: NEGATIVE
Leukocytes, UA: NEGATIVE
Leukocytes, UA: NEGATIVE
Nitrite, UA: NEGATIVE
Nitrite, UA: NEGATIVE
POC,PROTEIN,UA: NEGATIVE
POC,PROTEIN,UA: NEGATIVE
Spec Grav, UA: 1.015 (ref 1.010–1.025)
Spec Grav, UA: 1.015 (ref 1.010–1.025)
Urobilinogen, UA: 0.2 E.U./dL
Urobilinogen, UA: 0.2 E.U./dL
pH, UA: 6 (ref 5.0–8.0)
pH, UA: 6.5 (ref 5.0–8.0)

## 2018-12-20 LAB — CBC
HCT: 34.9 % — ABNORMAL LOW (ref 36.0–46.0)
Hemoglobin: 11.9 g/dL — ABNORMAL LOW (ref 12.0–15.0)
MCH: 32.2 pg (ref 26.0–34.0)
MCHC: 34.1 g/dL (ref 30.0–36.0)
MCV: 94.6 fL (ref 80.0–100.0)
Platelets: 294 10*3/uL (ref 150–400)
RBC: 3.69 MIL/uL — ABNORMAL LOW (ref 3.87–5.11)
RDW: 12.7 % (ref 11.5–15.5)
WBC: 13 10*3/uL — ABNORMAL HIGH (ref 4.0–10.5)
nRBC: 0 % (ref 0.0–0.2)

## 2018-12-20 LAB — PROTEIN / CREATININE RATIO, URINE
Creatinine, Urine: 31 mg/dL
Total Protein, Urine: <6 mg/dL

## 2018-12-20 MED ORDER — BETAMETHASONE SOD PHOS & ACET 6 (3-3) MG/ML IJ SUSP
12.0000 mg | Freq: Once | INTRAMUSCULAR | Status: AC
  Administered 2018-12-20: 12 mg via INTRAMUSCULAR

## 2018-12-20 NOTE — OB Triage Note (Signed)
Pt sent from office for [facility] labs. BP cycling q 15 mins. No abnormal edea noted. 2 + reflexes. Lungs clear. No clonus. Pt denies any HA, blurry vision, epigastric pain, bleeding or LOF. Reports positive fetal movement. Will continue to monitor.

## 2018-12-20 NOTE — OB Triage Provider Note (Signed)
Isabella Weaver is a 28 y.o. G1P0000 at 8657w2d who is admitted for evaluation of elevated blood pressure.  Estimated Date of Delivery: 01/29/19 Fetal presentation is cephalic.  Length of Stay:  0 Days. Admitted 12/20/2018  Subjective: Denies HA, visual changes, or epigastric pain Patient reports good fetal movement.  She reports no uterine contractions, no bleeding and no loss of fluid per vagina.  Vitals:  Blood pressure 129/89, pulse 91, temperature 98.9 F (37.2 C), temperature source Oral, height 5\' 7"  (1.702 m), weight 108.4 kg, last menstrual period 04/24/2018.  Vitals with BMI 12/20/2018 12/20/2018 12/20/2018  Height - - -  Weight - - -  BMI - - -  Systolic 129 130 [id number]  Diastolic 89 87 86  Pulse 91 91 97   Physical Examination: CONSTITUTIONAL: Well-developed, well-nourished female in no acute distress.  SKIN: Skin is warm and dry. No rash noted. Not diaphoretic. No erythema. No pallor. NEUROLGIC: Alert and oriented to person, place, and time. Normal reflexes, muscle tone coordination. No cranial nerve deficit noted. PSYCHIATRIC: Normal mood and affect. Normal behavior. Normal judgment and thought content. CARDIOVASCULAR: Normal heart rate noted, regular rhythm RESPIRATORY: Effort and breath sounds normal, no problems with respiration noted MUSCULOSKELETAL: Normal range of motion. Trace non-pitting edema and no tenderness. 2+ distal pulses. No clonus ABDOMEN: Soft, nontender, nondistended, gravid. CERVIX:  not examined  Fetal monitoring: FHR: 136 bpm, Variability: moderate, Accelerations: Present, Decelerations: Absent , NST reactive Uterine activity: no contractions per hour  Results for orders placed or performed during the hospital encounter of 12/20/18 (from the past 48 hour(s))  Protein / creatinine ratio, urine     Status: None   Collection Time: 12/20/18  1:10 PM  Result Value Ref Range   Creatinine, Urine 31 mg/dL   Total Protein, Urine <6 mg/dL    Comment: NO NORMAL RANGE  ESTABLISHED FOR THIS TEST   Protein Creatinine Ratio        0.00 - 0.15 mg/mg[Cre]    Comment: RESULT BELOW REPORTABLE RANGE, UNABLE TO CALCULATE. Performed at [facility], [address] [place], [place] [address]   CBC     Status: Abnormal   Collection Time: 12/20/18  1:20 PM  Result Value Ref Range   WBC 13.0 (H) 4.0 - 10.5 K/uL   RBC 3.69 (L) 3.87 - 5.11 MIL/uL   Hemoglobin 11.9 (L) 12.0 - 15.0 g/dL   HCT 54.034.9 (L) 98.136.0 - 19.146.0 %   MCV 94.6 80.0 - 100.0 fL   MCH 32.2 26.0 - 34.0 pg   MCHC 34.1 30.0 - 36.0 g/dL   RDW 47.812.7 29.511.5 - 62.115.5 %   Platelets 294 150 - 400 K/uL   nRBC 0.0 0.0 - 0.2 %    Comment: Performed at [facility], [address] [place], [place] [address]  Comprehensive metabolic panel     Status: Abnormal   Collection Time: 12/20/18  1:20 PM  Result Value Ref Range   Sodium 136 135 - 145 mmol/L   Potassium 3.8 3.5 - 5.1 mmol/L   Chloride 106 98 - 111 mmol/L   CO2 20 (L) 22 - 32 mmol/L   Glucose, Bld 116 (H) 70 - 99 mg/dL   BUN 6 6 - 20 mg/dL   Creatinine, Ser 7.840.61 0.44 - 1.00 mg/dL   Calcium 8.7 (L) 8.9 - 10.3 mg/dL   Total Protein 6.1 (L) 6.5 - 8.1 g/dL   Albumin 2.9 (L) 3.5 - 5.0 g/dL   AST 20 15 -  41 U/L   ALT 18 0 - 44 U/L   Alkaline Phosphatase 82 38 - 126 U/L   Total Bilirubin 0.3 0.3 - 1.2 mg/dL   GFR calc non Af Amer >60 >60 mL/min   GFR calc Af Amer >60 >60 mL/min   Anion gap 10 5 - 15    Comment: Performed at [facility], [address] [place], [place] [address]    No results found.  Current scheduled medications . betamethasone acetate-betamethasone sodium phosphate  12 mg Intramuscular Once    I have reviewed the patient's current medications.  ASSESSMENT: Patient Active Problem List   Diagnosis Date Noted  . Elevated blood pressure affecting pregnancy in third trimester, antepartum 12/20/2018  . Indication for care in labor or delivery 12/20/2018  . Indication for care in labor and delivery,  antepartum 10/10/2018  . Gastroesophageal reflux in pregnancy 10/09/2018  . History of chlamydia 08/30/2018  . Obesity 02/13/2015  . 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 Home BP monitor prescribed, and to notify us if diastolic >22 on three readings. To go home and rest until tomorrows visit. PIH precautions discussed.   Continue antenatal care with the addition of twice weekly NST and growth scan next week.  Dr Amalia Hailey aware of findings and agrees with plan of care.   [place], CNM [facility]

## 2018-12-20 NOTE — Progress Notes (Signed)
[name]- pt had 10lb wt gain, having some swelling, denies headache

## 2018-12-20 NOTE — Progress Notes (Signed)
[name] -reports difficulty sleeping at night, can't fall asleep until 4 am. Has noticed increase in braxton hicks- wraps around to back.  Denies any s/s of PIH, blood pressure in exam room after laying down for 15 minutes was 142/100. Sent to L&D for serial BP and PIH workup. Will plan recheck and NST in 5 days.flu vaccine given today.

## 2018-12-21 ENCOUNTER — Ambulatory Visit (INDEPENDENT_AMBULATORY_CARE_PROVIDER_SITE_OTHER): Payer: BC Managed Care – PPO | Admitting: Certified Nurse Midwife

## 2018-12-21 VITALS — BP 131/95 | HR 108 | Wt 236.0 lb

## 2018-12-21 DIAGNOSIS — O163 Unspecified maternal hypertension, third trimester: Secondary | ICD-10-CM

## 2018-12-21 DIAGNOSIS — Z3493 Encounter for supervision of normal pregnancy, unspecified, third trimester: Secondary | ICD-10-CM

## 2018-12-21 DIAGNOSIS — Z3A34 34 weeks gestation of pregnancy: Secondary | ICD-10-CM | POA: Diagnosis not present

## 2018-12-21 MED ORDER — BETAMETHASONE SOD PHOS & ACET 6 (3-3) MG/ML IJ SUSP
12.0000 mg | Freq: Once | INTRAMUSCULAR | Status: AC
  Administered 2018-12-21: 12 mg via INTRAMUSCULAR

## 2018-12-21 NOTE — Patient Instructions (Signed)
Preeclampsia and Eclampsia °Preeclampsia is a serious condition that may develop during pregnancy. This condition causes high blood pressure and increased protein in your urine along with other symptoms, such as headaches and vision changes. These symptoms may develop as the condition gets worse. Preeclampsia may occur at 20 weeks of pregnancy or later. °Diagnosing and treating preeclampsia early is very important. If not treated early, it can cause serious problems for you and your baby. One problem it can lead to is eclampsia. Eclampsia is a condition that causes muscle jerking or shaking (convulsions or seizures) and other serious problems for the mother. During pregnancy, delivering your baby may be the best treatment for preeclampsia or eclampsia. For most women, preeclampsia and eclampsia symptoms go away after giving birth. °In rare cases, a woman may develop preeclampsia after giving birth (postpartum preeclampsia). This usually occurs within 48 hours after childbirth but may occur up to 6 weeks after giving birth. °What are the causes? °The cause of preeclampsia is not known. °What increases the risk? °The following risk factors make you more likely to develop preeclampsia: °· Being pregnant for the first time. °· Having had preeclampsia during a past pregnancy. °· Having a family history of preeclampsia. °· Having high blood pressure. °· Being pregnant with more than one baby. °· Being 35 or older. °· Being African-American. °· Having kidney disease or diabetes. °· Having medical conditions such as lupus or blood diseases. °· Being very overweight (obese). °What are the signs or symptoms? °The most common symptoms are: °· Severe headaches. °· Vision problems, such as blurred or double vision. °· Abdominal pain, especially upper abdominal pain. °Other symptoms that may develop as the condition gets worse include: °· Sudden weight gain. °· Sudden swelling of the hands, face, legs, and feet. °· Severe nausea  and vomiting. °· Numbness in the face, arms, legs, and feet. °· Dizziness. °· Urinating less than usual. °· Slurred speech. °· Convulsions or seizures. °How is this diagnosed? °There are no screening tests for preeclampsia. Your health care provider will ask you about symptoms and check for signs of preeclampsia during your prenatal visits. You may also have tests that include: °· Checking your blood pressure. °· Urine tests to check for protein. Your health care provider will check for this at every prenatal visit. °· Blood tests. °· Monitoring your baby's heart rate. °· Ultrasound. °How is this treated? °You and your health care provider will determine the treatment approach that is best for you. Treatment may include: °· Having more frequent prenatal exams to check for signs of preeclampsia, if you have an increased risk for preeclampsia. °· Medicine to lower your blood pressure. °· Staying in the hospital, if your condition is severe. There, treatment will focus on controlling your blood pressure and the amount of fluids in your body (fluid retention). °· Taking medicine (magnesium sulfate) to prevent seizures. This may be given as an injection or through an IV. °· Taking a low-dose aspirin during your pregnancy. °· Delivering your baby early. You may have your labor started with medicine (induced), or you may have a cesarean delivery. °Follow these instructions at home: °Eating and drinking ° °· Drink enough fluid to keep your urine pale yellow. °· Avoid caffeine. °Lifestyle °· Do not use any products that contain nicotine or tobacco, such as cigarettes and e-cigarettes. If you need help quitting, ask your health care provider. °· Do not use alcohol or drugs. °· Avoid stress as much as possible. Rest and get   plenty of sleep. °General instructions °· Take over-the-counter and prescription medicines only as told by your health care provider. °· When lying down, lie on your left side. This keeps pressure off your  major blood vessels. °· When sitting or lying down, raise (elevate) your feet. Try putting some pillows underneath your lower legs. °· Exercise regularly. Ask your health care provider what kinds of exercise are best for you. °· Keep all follow-up and prenatal visits as told by your health care provider. This is important. °How is this prevented? °There is no known way of preventing preeclampsia or eclampsia from developing. However, to lower your risk of complications and detect problems early: °· Get regular prenatal care. Your health care provider may be able to diagnose and treat the condition early. °· Maintain a healthy weight. Ask your health care provider for help managing weight gain during pregnancy. °· Work with your health care provider to manage any long-term (chronic) health conditions you have, such as diabetes or kidney problems. °· You may have tests of your blood pressure and kidney function after giving birth. °· Your health care provider may have you take low-dose aspirin during your next pregnancy. °Contact a health care provider if: °· You have symptoms that your health care provider told you may require more treatment or monitoring, such as: °? Headaches. °? Nausea or vomiting. °? Abdominal pain. °? Dizziness. °? Light-headedness. °Get help right away if: °· You have severe: °? Abdominal pain. °? Headaches that do not get better. °? Dizziness. °? Vision problems. °? Confusion. °? Nausea or vomiting. °· You have any of the following: °? A seizure. °? Sudden, rapid weight gain. °? Sudden swelling in your hands, ankles, or face. °? Trouble moving any part of your body. °? Numbness in any part of your body. °? Trouble speaking. °? Abnormal bleeding. °· You faint. °Summary °· Preeclampsia is a serious condition that may develop during pregnancy. °· This condition causes high blood pressure and increased protein in your urine along with other symptoms, such as headaches and vision  changes. °· Diagnosing and treating preeclampsia early is very important. If not treated early, it can cause serious problems for you and your baby. °· Get help right away if you have symptoms that your health care provider told you to watch for. °This information is not intended to replace advice given to you by your health care provider. Make sure you discuss any questions you have with your health care provider. °Document Released: 04/01/2000 Document Revised: 12/05/2017 Document Reviewed: 11/09/2015 °Elsevier Patient Education © 2020 Elsevier Inc. ° °

## 2018-12-21 NOTE — Progress Notes (Signed)
Pt presents today for second dose of betamethasone, she denies headache , visual changes, epigastric pain. Swelling is negative today. States she noticed that swelling has resolved. Reflexes 2 + bilaterally, left foot 1 beat of clonus. Pt placed in recliner chair , when able to recline she was able to relax leg/foot better, no clonus noted with repeat assessment. Signs and symptoms reviewed. Follow up as scheduled on Tuesday.   Philip Aspen, CNM

## 2018-12-21 NOTE — Addendum Note (Signed)
Addended by: Durwin Glaze on: 12/21/2018 04:47 PM   Modules accepted: Orders

## 2018-12-21 NOTE — Progress Notes (Signed)
Patient comes in today for BP check and second steroid injection.

## 2018-12-25 ENCOUNTER — Ambulatory Visit (INDEPENDENT_AMBULATORY_CARE_PROVIDER_SITE_OTHER): Payer: BC Managed Care – PPO | Admitting: Obstetrics and Gynecology

## 2018-12-25 ENCOUNTER — Other Ambulatory Visit: Payer: BC Managed Care – PPO

## 2018-12-25 ENCOUNTER — Other Ambulatory Visit: Payer: Self-pay

## 2018-12-25 VITALS — BP 129/92 | HR 111

## 2018-12-25 DIAGNOSIS — Z3A35 35 weeks gestation of pregnancy: Secondary | ICD-10-CM | POA: Diagnosis not present

## 2018-12-25 DIAGNOSIS — R030 Elevated blood-pressure reading, without diagnosis of hypertension: Secondary | ICD-10-CM | POA: Diagnosis not present

## 2018-12-25 DIAGNOSIS — Z3493 Encounter for supervision of normal pregnancy, unspecified, third trimester: Secondary | ICD-10-CM

## 2018-12-25 DIAGNOSIS — O26893 Other specified pregnancy related conditions, third trimester: Secondary | ICD-10-CM | POA: Diagnosis not present

## 2018-12-25 NOTE — Progress Notes (Signed)
NST for elevated BP in pregnancy- denies any S/S PIH, BP readings unchanged at home. Adhering to modified bedrest. Will do growth scan at [facility] as our tech is out this week.  NST performed today was reviewed and was found to be reactive. Baseline 135-140 with Moderate variability; No decels noted. BP on NST machine 124/77, 131/80.  Continue recommended antenatal testing and prenatal care.

## 2018-12-27 ENCOUNTER — Other Ambulatory Visit: Payer: Self-pay

## 2018-12-27 ENCOUNTER — Ambulatory Visit
Admission: RE | Admit: 2018-12-27 | Discharge: 2018-12-27 | Disposition: A | Discharge status: Home / Self Care | Payer: BC Managed Care – PPO | Source: Ambulatory Visit | Attending: Obstetrics and Gynecology | Admitting: Obstetrics and Gynecology

## 2018-12-27 DIAGNOSIS — Z3493 Encounter for supervision of normal pregnancy, unspecified, third trimester: Secondary | ICD-10-CM

## 2018-12-28 ENCOUNTER — Ambulatory Visit (INDEPENDENT_AMBULATORY_CARE_PROVIDER_SITE_OTHER): Payer: BC Managed Care – PPO | Admitting: Certified Nurse Midwife

## 2018-12-28 ENCOUNTER — Other Ambulatory Visit: Payer: BC Managed Care – PPO

## 2018-12-28 VITALS — BP 134/67 | HR 92

## 2018-12-28 DIAGNOSIS — O163 Unspecified maternal hypertension, third trimester: Secondary | ICD-10-CM | POA: Diagnosis not present

## 2018-12-28 DIAGNOSIS — Z3A35 35 weeks gestation of pregnancy: Secondary | ICD-10-CM | POA: Diagnosis not present

## 2018-12-28 NOTE — Progress Notes (Signed)
[name] and NST-Growth ultrasound yesterday 23% with 22 cm AFI. Blood pressure at home remain normal. No signs of pre-eclampsia. Anticipatory guidance regarding course of prenatal care. Reviewed red flag symptoms and when to call. RTC x Tuesday and Friday for NSTs and ROBs or sooner if needed.   NONSTRESS TEST INTERPRETATION  INDICATIONS: Gestational hypertension and Obesity  FHR baseline: 135 bpm RESULTS:Reactive COMMENTS: Irregular contractions   PLAN: 1. Continue fetal kick counts twice a day. 2. Continue antepartum testing as scheduled-Biweekly   Diona Fanti, CNM [facility], [facility] 12/28/18 5:41 PM

## 2018-12-28 NOTE — Patient Instructions (Addendum)
Fetal Movement Counts Patient Name: ________________________________________________ Patient Due Date: ____________________ What is a fetal movement count?  A fetal movement count is the number of times that you feel your baby move during a certain amount of time. This may also be called a fetal kick count. A fetal movement count is recommended for every pregnant woman. You may be asked to start counting fetal movements as early as week 28 of your pregnancy. Pay attention to when your baby is most active. You may notice your baby's sleep and wake cycles. You may also notice things that make your baby move more. You should do a fetal movement count:  When your baby is normally most active.  At the same time each day. A good time to count movements is while you are resting, after having something to eat and drink. How do I count fetal movements? 1. Find a quiet, comfortable area. Sit, or lie down on your side. 2. Write down the date, the start time and stop time, and the number of movements that you felt between those two times. Take this information with you to your health care visits. 3. For 2 hours, count kicks, flutters, swishes, rolls, and jabs. You should feel at least 10 movements during 2 hours. 4. You may stop counting after you have felt 10 movements. 5. If you do not feel 10 movements in 2 hours, have something to eat and drink. Then, keep resting and counting for 1 hour. If you feel at least 4 movements during that hour, you may stop counting. Contact a health care provider if:  You feel fewer than 4 movements in 2 hours.  Your baby is not moving like he or she usually does. Date: ____________ Start time: ____________ Stop time: ____________ Movements: ____________ Date: ____________ Start time: ____________ Stop time: ____________ Movements: ____________ Date: ____________ Start time: ____________ Stop time: ____________ Movements: ____________ Date: ____________ Start time:  ____________ Stop time: ____________ Movements: ____________ Date: ____________ Start time: ____________ Stop time: ____________ Movements: ____________ Date: ____________ Start time: ____________ Stop time: ____________ Movements: ____________ Date: ____________ Start time: ____________ Stop time: ____________ Movements: ____________ Date: ____________ Start time: ____________ Stop time: ____________ Movements: ____________ Date: ____________ Start time: ____________ Stop time: ____________ Movements: ____________ This information is not intended to replace advice given to you by your health care provider. Make sure you discuss any questions you have with your health care provider. Document Released: 05/04/2006 Document Revised: 04/24/2018 Document Reviewed: 05/14/2015 Elsevier Patient Education  2020 Else[employer] Inc[employer] Nonstress Test A nonstress test is a procedure that is done during pregnancy in order to check the baby's heartbeat. The procedure can help show if the baby (fetus) is healthy. It is commonly done when:  The baby is past his or her due date.  The pregnancy is high risk.  The baby is moving less than normal.  The mother has lost a pregnancy in the past.  The health care provider suspects a problem with the baby's growth.  There is too much or too little amniotic fluid. The procedure is often done in the third trimester of pregnancy to find out if an early delivery is needed and whether such a delivery is safe. During a nonstress test, the baby's heartbeat is monitored when the baby is resting and when the baby is moving. If the baby is healthy, the heart rate will increase when he or she moves or kicks and will return to normal when he or she rests. Tell a health care   provider about:  Any allergies you have.  Any medical conditions you have.  All medicines you are taking, including vitamins, herbs, eye drops, creams, and over-the-counter medicines. What are the risks?  There are no risks to you or your baby from a nonstress test. This procedure should not be painful or uncomfortable. What happens before the procedure?  Eat a meal right before the test or as directed by your health care provider. Food may help encourage the baby to move.  Use the restroom right before the test. What happens during the procedure?  Two monitors will be placed on your abdomen. One will record the baby's heart rate and the other will record the contractions of your uterus.  You may be asked to lie down on your side or to sit upright.  You may be given a button to press when you feel your baby move.  Your health care provider will listen to your baby's heartbeat and recorded it. He or she may also watch your baby's heartbeat on a screen.  If the baby seems to be sleeping, you may be asked to drink some juice or soda, eat a snack, or change positions. The procedure may vary among health care providers and hospitals. What happens after the procedure?  Your health care provider will discuss the test results with you and make recommendations for the future. Depending on the results, your health care provider may order additional tests or another course of action.  If your health care provider gave you any diet or activity instructions, make sure to follow them.  Keep all follow-up visits as told by your health care provider. This is important. Summary  A nonstress test is a procedure that is done during pregnancy in order to check the baby's heartbeat. The procedure can help show if the baby is healthy.  The procedure is often done in the third trimester of pregnancy to find out if an early delivery is needed and whether such a delivery is safe.  During a nonstress test, the baby's heartbeat is monitored when the baby is resting and when the baby is moving. If the baby is healthy, the heart rate will increase when he or she moves or kicks and will return to normal when he or  she rests.  Your health care provider will discuss the test results with you and make recommendations for the future. This information is not intended to replace advice given to you by your health care provider. Make sure you discuss any questions you have with your health care provider. Document Released: 03/25/2002 Document Revised: 07/14/2016 Document Reviewed: 07/14/2016 Elsevier Patient Education  2020 Elsevier Inc.  

## 2019-01-01 ENCOUNTER — Other Ambulatory Visit: Payer: BC Managed Care – PPO

## 2019-01-01 ENCOUNTER — Other Ambulatory Visit: Payer: Self-pay

## 2019-01-01 ENCOUNTER — Ambulatory Visit (INDEPENDENT_AMBULATORY_CARE_PROVIDER_SITE_OTHER): Payer: BC Managed Care – PPO | Admitting: Certified Nurse Midwife

## 2019-01-01 VITALS — BP 141/89 | HR 112

## 2019-01-01 DIAGNOSIS — Z3493 Encounter for supervision of normal pregnancy, unspecified, third trimester: Secondary | ICD-10-CM

## 2019-01-01 DIAGNOSIS — Z3A36 36 weeks gestation of pregnancy: Secondary | ICD-10-CM | POA: Diagnosis not present

## 2019-01-01 DIAGNOSIS — O133 Gestational [pregnancy-induced] hypertension without significant proteinuria, third trimester: Secondary | ICD-10-CM | POA: Diagnosis not present

## 2019-01-01 DIAGNOSIS — Z3685 Encounter for antenatal screening for Streptococcus B: Secondary | ICD-10-CM

## 2019-01-01 DIAGNOSIS — Z113 Encounter for screening for infections with a predominantly sexual mode of transmission: Secondary | ICD-10-CM

## 2019-01-01 LAB — POCT URINALYSIS DIPSTICK OB
Bilirubin, UA: NEGATIVE
Blood, UA: NEGATIVE
Glucose, UA: NEGATIVE
Ketones, UA: NEGATIVE
Leukocytes, UA: NEGATIVE
Nitrite, UA: NEGATIVE
POC,PROTEIN,UA: NEGATIVE
Spec Grav, UA: <=1.005 — AB (ref 1.010–1.025)
Urobilinogen, UA: 0.2 E.U./dL
pH, UA: 6 (ref 5.0–8.0)

## 2019-01-01 NOTE — Patient Instructions (Signed)

## 2019-01-01 NOTE — Progress Notes (Signed)
[name]-Reports decreased fetal movement despite audible movement heard on EFM. Education regarding gestational hypertension, recommendations for antenatal testing, and timing of labor induction. Patient declined IOL at this time. Herbal prep handout provided, counseled against use of Blue Cohosh due to hypertension. 36 week cultures collected, see orders. Reviewed red flag symptoms and when to call. RTC on Tuesday for NST and [name]. RTC x Friday for NST and [name] or sooner if needed.   NONSTRESS TEST INTERPRETATION  INDICATIONS: Gestational Hypertension  FHR baseline: 130-135 bpm RESULTS: Reactive COMMENTS: Irregular contractions   PLAN: 1. Continue fetal kick counts twice a day. 2. Continue antepartum testing as scheduled-Biweekly

## 2019-01-03 LAB — STREP GP B NAA: Strep Gp B NAA: NEGATIVE

## 2019-01-04 ENCOUNTER — Ambulatory Visit (INDEPENDENT_AMBULATORY_CARE_PROVIDER_SITE_OTHER): Payer: BC Managed Care – PPO | Admitting: Certified Nurse Midwife

## 2019-01-04 ENCOUNTER — Other Ambulatory Visit: Payer: Self-pay

## 2019-01-04 ENCOUNTER — Other Ambulatory Visit: Payer: BC Managed Care – PPO

## 2019-01-04 VITALS — BP 125/85 | HR 105 | Wt 243.6 lb

## 2019-01-04 DIAGNOSIS — O133 Gestational [pregnancy-induced] hypertension without significant proteinuria, third trimester: Secondary | ICD-10-CM

## 2019-01-04 DIAGNOSIS — Z3A36 36 weeks gestation of pregnancy: Secondary | ICD-10-CM | POA: Diagnosis not present

## 2019-01-04 DIAGNOSIS — Z3493 Encounter for supervision of normal pregnancy, unspecified, third trimester: Secondary | ICD-10-CM

## 2019-01-04 LAB — GC/CHLAMYDIA PROBE AMP
Chlamydia trachomatis, NAA: NEGATIVE
Neisseria Gonorrhoeae by PCR: NEGATIVE

## 2019-01-04 NOTE — Patient Instructions (Addendum)
Nonstress Test A nonstress test is a procedure that is done during pregnancy in order to check the baby's heartbeat. The procedure can help show if the baby (fetus) is healthy. It is commonly done when:  The baby is past his or her due date.  The pregnancy is high risk.  The baby is moving less than normal.  The mother has lost a pregnancy in the past.  The health care provider suspects a problem with the baby's growth.  There is too much or too little amniotic fluid. The procedure is often done in the third trimester of pregnancy to find out if an early delivery is needed and whether such a delivery is safe. During a nonstress test, the baby's heartbeat is monitored when the baby is resting and when the baby is moving. If the baby is healthy, the heart rate will increase when he or she moves or kicks and will return to normal when he or she rests. Tell a health care provider about:  Any allergies you have.  Any medical conditions you have.  All medicines you are taking, including vitamins, herbs, eye drops, creams, and over-the-counter medicines. What are the risks? There are no risks to you or your baby from a nonstress test. This procedure should not be painful or uncomfortable. What happens before the procedure?  Eat a meal right before the test or as directed by your health care provider. Food may help encourage the baby to move.  Use the restroom right before the test. What happens during the procedure?  Two monitors will be placed on your abdomen. One will record the baby's heart rate and the other will record the contractions of your uterus.  You may be asked to lie down on your side or to sit upright.  You may be given a button to press when you feel your baby move.  Your health care provider will listen to your baby's heartbeat and recorded it. He or she may also watch your baby's heartbeat on a screen.  If the baby seems to be sleeping, you may be asked to drink  some juice or soda, eat a snack, or change positions. The procedure may vary among health care providers and hospitals. What happens after the procedure?  Your health care provider will discuss the test results with you and make recommendations for the future. Depending on the results, your health care provider may order additional tests or another course of action.  If your health care provider gave you any diet or activity instructions, make sure to follow them.  Keep all follow-up visits as told by your health care provider. This is important. Summary  A nonstress test is a procedure that is done during pregnancy in order to check the baby's heartbeat. The procedure can help show if the baby is healthy.  The procedure is often done in the third trimester of pregnancy to find out if an early delivery is needed and whether such a delivery is safe.  During a nonstress test, the baby's heartbeat is monitored when the baby is resting and when the baby is moving. If the baby is healthy, the heart rate will increase when he or she moves or kicks and will return to normal when he or she rests.  Your health care provider will discuss the test results with you and make recommendations for the future. This information is not intended to replace advice given to you by your health care provider. Make sure you discuss any   questions you have with your health care provider. Document Released: 03/25/2002 Document Revised: 07/14/2016 Document Reviewed: 07/14/2016 Elsevier Patient Education  2020 Else[employer] Inc. Fetal Movement Counts Patient Name: ________________________________________________ Patient Due Date: ____________________ What is a fetal movement count?  A fetal movement count is the number of times that you feel your baby move during a certain amount of time. This may also be called a fetal kick count. A fetal movement count is recommended for every pregnant woman. You may be asked to start  counting fetal movements as early as week 28 of your pregnancy. Pay attention to when your baby is most active. You may notice your baby's sleep and wake cycles. You may also notice things that make your baby move more. You should do a fetal movement count:  When your baby is normally most active.  At the same time each day. A good time to count movements is while you are resting, after having something to eat and drink. How do I count fetal movements? 1. Find a quiet, comfortable area. Sit, or lie down on your side. 2. Write down the date, the start time and stop time, and the number of movements that you felt between those two times. Take this information with you to your health care visits. 3. For 2 hours, count kicks, flutters, swishes, rolls, and jabs. You should feel at least 10 movements during 2 hours. 4. You may stop counting after you have felt 10 movements. 5. If you do not feel 10 movements in 2 hours, have something to eat and drink. Then, keep resting and counting for 1 hour. If you feel at least 4 movements during that hour, you may stop counting. Contact a health care provider if:  You feel fewer than 4 movements in 2 hours.  Your baby is not moving like he or she usually does. Date: ____________ Start time: ____________ Stop time: ____________ Movements: ____________ Date: ____________ Start time: ____________ Stop time: ____________ Movements: ____________ Date: ____________ Start time: ____________ Stop time: ____________ Movements: ____________ Date: ____________ Start time: ____________ Stop time: ____________ Movements: ____________ Date: ____________ Start time: ____________ Stop time: ____________ Movements: ____________ Date: ____________ Start time: ____________ Stop time: ____________ Movements: ____________ Date: ____________ Start time: ____________ Stop time: ____________ Movements: ____________ Date: ____________ Start time: ____________ Stop time: ____________  Movements: ____________ Date: ____________ Start time: ____________ Stop time: ____________ Movements: ____________ This information is not intended to replace advice given to you by your health care provider. Make sure you discuss any questions you have with your health care provider. Document Released: 05/04/2006 Document Revised: 04/24/2018 Document Reviewed: 05/14/2015 Elsevier Patient Education  2020 Elsevier Inc.  

## 2019-01-04 NOTE — Progress Notes (Signed)
[name]-Doing well, no questions or concerns. Requests SVE, unchanged since previous visit. Discussed GBS negative status, verbalized understanding. Reviewed red flag symptoms and when to call. RTC on Tuesday for NST and [name]. RTC x Friday for NST and [name] or sooner if needed.   NONSTRESS TEST INTERPRETATION  INDICATIONS: Gestational Hypertension  FHR baseline: 135-140 bpm, single variable deceleration noted RESULTS: Reactive COMMENTS: Irregular contractions   PLAN: 1. Continue fetal kick counts twice a day. 2. Continue antepartum testing as scheduled-Biweekly   Diona Fanti, CNM [facility], [facility] 01/04/19 11:34 AM

## 2019-01-08 ENCOUNTER — Other Ambulatory Visit: Payer: BC Managed Care – PPO

## 2019-01-08 ENCOUNTER — Ambulatory Visit (INDEPENDENT_AMBULATORY_CARE_PROVIDER_SITE_OTHER): Payer: BC Managed Care – PPO | Admitting: Obstetrics and Gynecology

## 2019-01-08 ENCOUNTER — Other Ambulatory Visit: Payer: Self-pay

## 2019-01-08 VITALS — BP 136/94 | HR 90 | Wt 246.2 lb

## 2019-01-08 DIAGNOSIS — Z3A37 37 weeks gestation of pregnancy: Secondary | ICD-10-CM

## 2019-01-08 DIAGNOSIS — O163 Unspecified maternal hypertension, third trimester: Secondary | ICD-10-CM | POA: Diagnosis not present

## 2019-01-08 DIAGNOSIS — Z3493 Encounter for supervision of normal pregnancy, unspecified, third trimester: Secondary | ICD-10-CM

## 2019-01-08 NOTE — Progress Notes (Signed)
[name] & NST- doing well, denies s/s of elevated BP at home.  NST performed today was reviewed and was found to be reactive. Baseline 130-140 with Moderate variability; No decels noted. BP while on NST monitor 125/70, 130/74, 126/77  Continue recommended bi-weekly antenatal testing and prenatal care.

## 2019-01-08 NOTE — Progress Notes (Signed)
[name] NST 

## 2019-01-11 ENCOUNTER — Ambulatory Visit (INDEPENDENT_AMBULATORY_CARE_PROVIDER_SITE_OTHER): Payer: BC Managed Care – PPO

## 2019-01-11 ENCOUNTER — Other Ambulatory Visit: Payer: Self-pay

## 2019-01-11 ENCOUNTER — Ambulatory Visit (INDEPENDENT_AMBULATORY_CARE_PROVIDER_SITE_OTHER): Payer: BC Managed Care – PPO | Admitting: Certified Nurse Midwife

## 2019-01-11 ENCOUNTER — Other Ambulatory Visit: Payer: BC Managed Care – PPO

## 2019-01-11 VITALS — BP 138/74 | HR 88 | Wt 245.6 lb

## 2019-01-11 DIAGNOSIS — Z3493 Encounter for supervision of normal pregnancy, unspecified, third trimester: Secondary | ICD-10-CM

## 2019-01-11 DIAGNOSIS — O471 False labor at or after 37 completed weeks of gestation: Secondary | ICD-10-CM

## 2019-01-11 DIAGNOSIS — Z362 Encounter for other antenatal screening follow-up: Secondary | ICD-10-CM

## 2019-01-11 DIAGNOSIS — Z3A37 37 weeks gestation of pregnancy: Secondary | ICD-10-CM

## 2019-01-11 DIAGNOSIS — O163 Unspecified maternal hypertension, third trimester: Secondary | ICD-10-CM

## 2019-01-11 LAB — POCT URINALYSIS DIPSTICK OB
Bilirubin, UA: NEGATIVE
Blood, UA: NEGATIVE
Glucose, UA: NEGATIVE
Ketones, UA: NEGATIVE
Leukocytes, UA: NEGATIVE
Nitrite, UA: NEGATIVE
POC,PROTEIN,UA: NEGATIVE
Spec Grav, UA: 1.01 (ref 1.010–1.025)
Urobilinogen, UA: 0.2 E.U./dL
pH, UA: 6 (ref 5.0–8.0)

## 2019-01-11 NOTE — Patient Instructions (Signed)
Fetal Movement Counts Patient Name: ________________________________________________ Patient Due Date: ____________________ What is a fetal movement count?  A fetal movement count is the number of times that you feel your baby move during a certain amount of time. This may also be called a fetal kick count. A fetal movement count is recommended for every pregnant woman. You may be asked to start counting fetal movements as early as week 28 of your pregnancy. Pay attention to when your baby is most active. You may notice your baby's sleep and wake cycles. You may also notice things that make your baby move more. You should do a fetal movement count:  When your baby is normally most active.  At the same time each day. A good time to count movements is while you are resting, after having something to eat and drink. How do I count fetal movements? 1. Find a quiet, comfortable area. Sit, or lie down on your side. 2. Write down the date, the start time and stop time, and the number of movements that you felt between those two times. Take this information with you to your health care visits. 3. For 2 hours, count kicks, flutters, swishes, rolls, and jabs. You should feel at least 10 movements during 2 hours. 4. You may stop counting after you have felt 10 movements. 5. If you do not feel 10 movements in 2 hours, have something to eat and drink. Then, keep resting and counting for 1 hour. If you feel at least 4 movements during that hour, you may stop counting. Contact a health care provider if:  You feel fewer than 4 movements in 2 hours.  Your baby is not moving like he or she usually does. Date: ____________ Start time: ____________ Stop time: ____________ Movements: ____________ Date: ____________ Start time: ____________ Stop time: ____________ Movements: ____________ Date: ____________ Start time: ____________ Stop time: ____________ Movements: ____________ Date: ____________ Start time:  ____________ Stop time: ____________ Movements: ____________ Date: ____________ Start time: ____________ Stop time: ____________ Movements: ____________ Date: ____________ Start time: ____________ Stop time: ____________ Movements: ____________ Date: ____________ Start time: ____________ Stop time: ____________ Movements: ____________ Date: ____________ Start time: ____________ Stop time: ____________ Movements: ____________ Date: ____________ Start time: ____________ Stop time: ____________ Movements: ____________ This information is not intended to replace advice given to you by your health care provider. Make sure you discuss any questions you have with your health care provider. Document Released: 05/04/2006 Document Revised: 04/24/2018 Document Reviewed: 05/14/2015 Elsevier Patient Education  2020 [employer] Nonstress Test A nonstress test is a procedure that is done during pregnancy in order to check the baby's heartbeat. The procedure can help show if the baby (fetus) is healthy. It is commonly done when:  The baby is past his or her due date.  The pregnancy is high risk.  The baby is moving less than normal.  The mother has lost a pregnancy in the past.  The health care provider suspects a problem with the baby's growth.  There is too much or too little amniotic fluid. The procedure is often done in the third trimester of pregnancy to find out if an early delivery is needed and whether such a delivery is safe. During a nonstress test, the baby's heartbeat is monitored when the baby is resting and when the baby is moving. If the baby is healthy, the heart rate will increase when he or she moves or kicks and will return to normal when he or she rests. Tell a health care  provider about:  Any allergies you have.  Any medical conditions you have.  All medicines you are taking, including vitamins, herbs, eye drops, creams, and over-the-counter medicines. What are the risks?  There are no risks to you or your baby from a nonstress test. This procedure should not be painful or uncomfortable. What happens before the procedure?  Eat a meal right before the test or as directed by your health care provider. Food may help encourage the baby to move.  Use the restroom right before the test. What happens during the procedure?  Two monitors will be placed on your abdomen. One will record the baby's heart rate and the other will record the contractions of your uterus.  You may be asked to lie down on your side or to sit upright.  You may be given a button to press when you feel your baby move.  Your health care provider will listen to your baby's heartbeat and recorded it. He or she may also watch your baby's heartbeat on a screen.  If the baby seems to be sleeping, you may be asked to drink some juice or soda, eat a snack, or change positions. The procedure may vary among health care providers and hospitals. What happens after the procedure?  Your health care provider will discuss the test results with you and make recommendations for the future. Depending on the results, your health care provider may order additional tests or another course of action.  If your health care provider gave you any diet or activity instructions, make sure to follow them.  Keep all follow-up visits as told by your health care provider. This is important. Summary  A nonstress test is a procedure that is done during pregnancy in order to check the baby's heartbeat. The procedure can help show if the baby is healthy.  The procedure is often done in the third trimester of pregnancy to find out if an early delivery is needed and whether such a delivery is safe.  During a nonstress test, the baby's heartbeat is monitored when the baby is resting and when the baby is moving. If the baby is healthy, the heart rate will increase when he or she moves or kicks and will return to normal when he or  she rests.  Your health care provider will discuss the test results with you and make recommendations for the future. This information is not intended to replace advice given to you by your health care provider. Make sure you discuss any questions you have with your health care provider. Document Released: 03/25/2002 Document Revised: 07/14/2016 Document Reviewed: 07/14/2016 Elsevier Patient Education  [place].

## 2019-01-11 NOTE — Progress Notes (Signed)
[name] and NST- Patient c/o intermittent UC. Elevated blood pressures on NST machine after difficulty finding fetal heart rate. Manual checks: 138/74, 132/88. Growth and AFI wnl. Anticipatory guidance regarding course of prenatal care and continued antenatal testing. Reviewed red flag symptoms, signs of pre-eclampsia and when to call. RTC x Tuesday for NST and [name]. RTC x Friday for NST and [name] or sooner if needed.   NONSTRESS TEST INTERPRETATION  INDICATIONS: Gestational hypertension and Obesity  FHR baseline: 135 bpm RESULTS:Reactive COMMENTS: Irregular contractions   PLAN: 1. Continue fetal kick counts twice a day. 2. Continue antepartum testing as scheduled-Biweekly  ULTRASOUND REPORT  Location: [facility] Date of Service: 01/11/2019   Indications:growth/afi Findings:  Isabella Weaver intrauterine pregnancy is visualized with FHR at 155 BPM. Biometrics give an (U/S) Gestational age of [redacted]w[redacted]d and an (U/S) EDD of 02/05/2019; this correlates with the clinically established Estimated Date of Delivery: 01/29/19.  Fetal presentation is Cephalic.  Placenta: posterior. Grade: 1 AFI: 12.5 cm  Growth percentile is 43. EFW: 3000g 6 lbs 10 oz)   Impression: 1. [redacted]w[redacted]d Viable Singleton Intrauterine pregnancy previously established criteria. 2. Growth is 43 %ile.  AFI is 12.5 cm.   Recommendations: 1.Clinical correlation with the patient's History and Physical Exam.

## 2019-01-11 NOTE — Progress Notes (Deleted)
ULTRASOUND REPORT  Location: [facility] Date of Service: 01/11/2019   Indications:growth/afi Findings:  Isabella Weaver intrauterine pregnancy is visualized with FHR at 155 BPM. Biometrics give an (U/S) Gestational age of [redacted]w[redacted]d and an (U/S) EDD of 02/05/2019; this correlates with the clinically established Estimated Date of Delivery: 01/29/19.  Fetal presentation is Cephalic.  Placenta: posterior. Grade: 1 AFI: 12.5 cm  Growth percentile is 43. EFW: 3000g 6 lbs 10 oz)   Impression: 1. [redacted]w[redacted]d Viable Singleton Intrauterine pregnancy previously established criteria. 2. Growth is 43 %ile.  AFI is 12.5 cm.   Recommendations: 1.Clinical correlation with the patient's History and Physical Exam.

## 2019-01-15 ENCOUNTER — Encounter: Payer: Self-pay | Admitting: Certified Nurse Midwife

## 2019-01-15 ENCOUNTER — Other Ambulatory Visit: Payer: Self-pay

## 2019-01-15 ENCOUNTER — Other Ambulatory Visit: Payer: BC Managed Care – PPO

## 2019-01-15 ENCOUNTER — Ambulatory Visit (INDEPENDENT_AMBULATORY_CARE_PROVIDER_SITE_OTHER): Payer: BC Managed Care – PPO | Admitting: Certified Nurse Midwife

## 2019-01-15 VITALS — BP 127/81 | HR 80 | Wt 248.4 lb

## 2019-01-15 DIAGNOSIS — O133 Gestational [pregnancy-induced] hypertension without significant proteinuria, third trimester: Secondary | ICD-10-CM

## 2019-01-15 DIAGNOSIS — Z3A38 38 weeks gestation of pregnancy: Secondary | ICD-10-CM

## 2019-01-15 DIAGNOSIS — Z3493 Encounter for supervision of normal pregnancy, unspecified, third trimester: Secondary | ICD-10-CM

## 2019-01-15 LAB — POCT URINALYSIS DIPSTICK OB
Bilirubin, UA: NEGATIVE
Blood, UA: NEGATIVE
Glucose, UA: NEGATIVE
Ketones, UA: NEGATIVE
Leukocytes, UA: NEGATIVE
Nitrite, UA: NEGATIVE
POC,PROTEIN,UA: NEGATIVE
Spec Grav, UA: 1.02 (ref 1.010–1.025)
Urobilinogen, UA: 0.2 E.U./dL
pH, UA: 7 (ref 5.0–8.0)

## 2019-01-15 NOTE — Patient Instructions (Signed)
Braxton Hicks Contractions Contractions of the uterus can occur throughout pregnancy, but they are not always a sign that you are in labor. You may have practice contractions called Braxton Hicks contractions. These false labor contractions are sometimes confused with true labor. What are Braxton Hicks contractions? Braxton Hicks contractions are tightening movements that occur in the muscles of the uterus before labor. Unlike true labor contractions, these contractions do not result in opening (dilation) and thinning of the cervix. Toward the end of pregnancy (32-34 weeks), Braxton Hicks contractions can happen more often and may become stronger. These contractions are sometimes difficult to tell apart from true labor because they can be very uncomfortable. You should not feel embarrassed if you go to the hospital with false labor. Sometimes, the only way to tell if you are in true labor is for your health care provider to look for changes in the cervix. The health care provider will do a physical exam and may monitor your contractions. If you are not in true labor, the exam should show that your cervix is not dilating and your water has not broken. If there are no other health problems associated with your pregnancy, it is completely safe for you to be sent home with false labor. You may continue to have Braxton Hicks contractions until you go into true labor. How to tell the difference between true labor and false labor True labor  Contractions last 30-70 seconds.  Contractions become very regular.  Discomfort is usually felt in the top of the uterus, and it spreads to the lower abdomen and low back.  Contractions do not go away with walking.  Contractions usually become more intense and increase in frequency.  The cervix dilates and gets thinner. False labor  Contractions are usually shorter and not as strong as true labor contractions.  Contractions are usually irregular.  Contractions  are often felt in the front of the lower abdomen and in the groin.  Contractions may go away when you walk around or change positions while lying down.  Contractions get weaker and are shorter-lasting as time goes on.  The cervix usually does not dilate or become thin. Follow these instructions at home:   Take over-the-counter and prescription medicines only as told by your health care provider.  Keep up with your usual exercises and follow other instructions from your health care provider.  Eat and drink lightly if you think you are going into labor.  If Braxton Hicks contractions are making you uncomfortable: ? Change your position from lying down or resting to walking, or change from walking to resting. ? Sit and rest in a tub of warm water. ? Drink enough fluid to keep your urine pale yellow. Dehydration may cause these contractions. ? Do slow and deep breathing several times an hour.  Keep all follow-up prenatal visits as told by your health care provider. This is important. Contact a health care provider if:  You have a fever.  You have continuous pain in your abdomen. Get help right away if:  Your contractions become stronger, more regular, and closer together.  You have fluid leaking or gushing from your vagina.  You pass blood-tinged mucus (bloody show).  You have bleeding from your vagina.  You have low back pain that you never had before.  You feel your baby's head pushing down and causing pelvic pressure.  Your baby is not moving inside you as much as it used to. Summary  Contractions that occur before labor are   called Braxton Hicks contractions, false labor, or practice contractions.  Braxton Hicks contractions are usually shorter, weaker, farther apart, and less regular than true labor contractions. True labor contractions usually become progressively stronger and regular, and they become more frequent.  Manage discomfort from Braxton Hicks contractions  by changing position, resting in a warm bath, drinking plenty of water, or practicing deep breathing. This information is not intended to replace advice given to you by your health care provider. Make sure you discuss any questions you have with your health care provider. Document Released: 08/18/2016 Document Revised: 03/17/2017 Document Reviewed: 08/18/2016 Elsevier Patient Education  2020 Elsevier Inc.  

## 2019-01-15 NOTE — Progress Notes (Signed)
[name] doing well. Feels good movement. NST today Reactive, basline 130, accel present, decel absent. Moderate variability . Toco irritability . Bp today all with normal limits. Mild swelling, negative clonus, 1 + reflexes bilaterally. She has occasional headache related to weather changs, state they go away with tylenol. She denies epigastric pain or visual changes. Discussed possible induction due to gestational hypertension. She states she would prefer to have a natural labor and is declining at this time. Will continue to monitor BP and signs/symptoms pre e

## 2019-01-18 ENCOUNTER — Other Ambulatory Visit: Payer: Self-pay

## 2019-01-18 ENCOUNTER — Other Ambulatory Visit: Payer: BC Managed Care – PPO

## 2019-01-18 ENCOUNTER — Ambulatory Visit (INDEPENDENT_AMBULATORY_CARE_PROVIDER_SITE_OTHER): Payer: BC Managed Care – PPO | Admitting: Certified Nurse Midwife

## 2019-01-18 VITALS — BP 137/77 | HR 118 | Wt 247.8 lb

## 2019-01-18 DIAGNOSIS — O133 Gestational [pregnancy-induced] hypertension without significant proteinuria, third trimester: Secondary | ICD-10-CM | POA: Diagnosis not present

## 2019-01-18 DIAGNOSIS — O99213 Obesity complicating pregnancy, third trimester: Secondary | ICD-10-CM

## 2019-01-18 DIAGNOSIS — Z3A39 39 weeks gestation of pregnancy: Secondary | ICD-10-CM

## 2019-01-18 DIAGNOSIS — Z3493 Encounter for supervision of normal pregnancy, unspecified, third trimester: Secondary | ICD-10-CM

## 2019-01-18 LAB — POCT URINALYSIS DIPSTICK OB
Bilirubin, UA: NEGATIVE
Blood, UA: NEGATIVE
Glucose, UA: NEGATIVE
Ketones, UA: NEGATIVE
Leukocytes, UA: NEGATIVE
Nitrite, UA: NEGATIVE
POC,PROTEIN,UA: NEGATIVE
Spec Grav, UA: 1.01 (ref 1.010–1.025)
Urobilinogen, UA: 0.2 E.U./dL
pH, UA: 6 (ref 5.0–8.0)

## 2019-01-18 NOTE — Patient Instructions (Signed)
Nonstress Test A nonstress test is a procedure that is done during pregnancy in order to check the baby's heartbeat. The procedure can help show if the baby (fetus) is healthy. It is commonly done when:  The baby is past his or her due date.  The pregnancy is high risk.  The baby is moving less than normal.  The mother has lost a pregnancy in the past.  The health care provider suspects a problem with the baby's growth.  There is too much or too little amniotic fluid. The procedure is often done in the third trimester of pregnancy to find out if an early delivery is needed and whether such a delivery is safe. During a nonstress test, the baby's heartbeat is monitored when the baby is resting and when the baby is moving. If the baby is healthy, the heart rate will increase when he or she moves or kicks and will return to normal when he or she rests. Tell a health care provider about:  Any allergies you have.  Any medical conditions you have.  All medicines you are taking, including vitamins, herbs, eye drops, creams, and over-the-counter medicines. What are the risks? There are no risks to you or your baby from a nonstress test. This procedure should not be painful or uncomfortable. What happens before the procedure?  Eat a meal right before the test or as directed by your health care provider. Food may help encourage the baby to move.  Use the restroom right before the test. What happens during the procedure?  Two monitors will be placed on your abdomen. One will record the baby's heart rate and the other will record the contractions of your uterus.  You may be asked to lie down on your side or to sit upright.  You may be given a button to press when you feel your baby move.  Your health care provider will listen to your baby's heartbeat and recorded it. He or she may also watch your baby's heartbeat on a screen.  If the baby seems to be sleeping, you may be asked to drink  some juice or soda, eat a snack, or change positions. The procedure may vary among health care providers and hospitals. What happens after the procedure?  Your health care provider will discuss the test results with you and make recommendations for the future. Depending on the results, your health care provider may order additional tests or another course of action.  If your health care provider gave you any diet or activity instructions, make sure to follow them.  Keep all follow-up visits as told by your health care provider. This is important. Summary  A nonstress test is a procedure that is done during pregnancy in order to check the baby's heartbeat. The procedure can help show if the baby is healthy.  The procedure is often done in the third trimester of pregnancy to find out if an early delivery is needed and whether such a delivery is safe.  During a nonstress test, the baby's heartbeat is monitored when the baby is resting and when the baby is moving. If the baby is healthy, the heart rate will increase when he or she moves or kicks and will return to normal when he or she rests.  Your health care provider will discuss the test results with you and make recommendations for the future. This information is not intended to replace advice given to you by your health care provider. Make sure you discuss any   questions you have with your health care provider. Document Released: 03/25/2002 Document Revised: 07/14/2016 Document Reviewed: 07/14/2016 Elsevier Patient Education  2020 Elsevier Inc. Fetal Movement Counts Patient Name: ________________________________________________ Patient Due Date: ____________________ What is a fetal movement count?  A fetal movement count is the number of times that you feel your baby move during a certain amount of time. This may also be called a fetal kick count. A fetal movement count is recommended for every pregnant woman. You may be asked to start  counting fetal movements as early as week 28 of your pregnancy. Pay attention to when your baby is most active. You may notice your baby's sleep and wake cycles. You may also notice things that make your baby move more. You should do a fetal movement count:  When your baby is normally most active.  At the same time each day. A good time to count movements is while you are resting, after having something to eat and drink. How do I count fetal movements? 1. Find a quiet, comfortable area. Sit, or lie down on your side. 2. Write down the date, the start time and stop time, and the number of movements that you felt between those two times. Take this information with you to your health care visits. 3. For 2 hours, count kicks, flutters, swishes, rolls, and jabs. You should feel at least 10 movements during 2 hours. 4. You may stop counting after you have felt 10 movements. 5. If you do not feel 10 movements in 2 hours, have something to eat and drink. Then, keep resting and counting for 1 hour. If you feel at least 4 movements during that hour, you may stop counting. Contact a health care provider if:  You feel fewer than 4 movements in 2 hours.  Your baby is not moving like he or she usually does. Date: ____________ Start time: ____________ Stop time: ____________ Movements: ____________ Date: ____________ Start time: ____________ Stop time: ____________ Movements: ____________ Date: ____________ Start time: ____________ Stop time: ____________ Movements: ____________ Date: ____________ Start time: ____________ Stop time: ____________ Movements: ____________ Date: ____________ Start time: ____________ Stop time: ____________ Movements: ____________ Date: ____________ Start time: ____________ Stop time: ____________ Movements: ____________ Date: ____________ Start time: ____________ Stop time: ____________ Movements: ____________ Date: ____________ Start time: ____________ Stop time: ____________  Movements: ____________ Date: ____________ Start time: ____________ Stop time: ____________ Movements: ____________ This information is not intended to replace advice given to you by your health care provider. Make sure you discuss any questions you have with your health care provider. Document Released: 05/04/2006 Document Revised: 04/24/2018 Document Reviewed: 05/14/2015 Elsevier Patient Education  2020 Elsevier Inc.  

## 2019-01-18 NOTE — Progress Notes (Signed)
[name] &NST-Doing well, no questions or concerns. Anticipatory guidance regarding course of prenatal care. Reviewed red flag symptoms and when to call. RTC for [name] and NST on Tuesday and Friday next week or sooner if needed.   NONSTRESS TEST INTERPRETATION  INDICATIONS: Gestational Hypertension and Obesity  FHR baseline: 130 bpm RESULTS:Reactive COMMENTS: Irregular contractions   PLAN: 1. Continue fetal kick counts twice a day. 2. Continue antepartum testing as scheduled-Biweekly   Diona Fanti, CNM [facility], [facility] 01/18/19 11:44 AM

## 2019-01-22 ENCOUNTER — Other Ambulatory Visit: Payer: Self-pay

## 2019-01-22 ENCOUNTER — Other Ambulatory Visit: Payer: BC Managed Care – PPO

## 2019-01-22 ENCOUNTER — Ambulatory Visit (INDEPENDENT_AMBULATORY_CARE_PROVIDER_SITE_OTHER): Payer: BC Managed Care – PPO | Admitting: Certified Nurse Midwife

## 2019-01-22 VITALS — BP 127/87 | HR 82 | Wt 248.4 lb

## 2019-01-22 DIAGNOSIS — O133 Gestational [pregnancy-induced] hypertension without significant proteinuria, third trimester: Secondary | ICD-10-CM

## 2019-01-22 DIAGNOSIS — Z3A39 39 weeks gestation of pregnancy: Secondary | ICD-10-CM | POA: Diagnosis not present

## 2019-01-22 DIAGNOSIS — Z3493 Encounter for supervision of normal pregnancy, unspecified, third trimester: Secondary | ICD-10-CM

## 2019-01-22 LAB — POCT URINALYSIS DIPSTICK OB
Bilirubin, UA: NEGATIVE
Blood, UA: NEGATIVE
Glucose, UA: NEGATIVE
Ketones, UA: NEGATIVE
Leukocytes, UA: NEGATIVE
Nitrite, UA: NEGATIVE
POC,PROTEIN,UA: NEGATIVE
Spec Grav, UA: 1.015 (ref 1.010–1.025)
Urobilinogen, UA: 0.2 E.U./dL
pH, UA: 6 (ref 5.0–8.0)

## 2019-01-22 NOTE — Addendum Note (Signed)
Addended by: Raliegh Ip on: 01/22/2019 02:27 PM   Modules accepted: Orders

## 2019-01-22 NOTE — Progress Notes (Signed)
ROBand NST for gestational hypertension, Reacitve NST. Baseline 130's with accelerations, moderate variabiity,no decelrations. Occasional contractions. Discussed recommendations for induction due to gestational hypertension. Pt verbalizes understanding but declines induction at this time . She would like a natural labor and birth. She will continue with 2 x wkl testing. She will have a u/s @ 40 wks bpp, growth.

## 2019-01-22 NOTE — Patient Instructions (Signed)
Braxton Hicks Contractions Contractions of the uterus can occur throughout pregnancy, but they are not always a sign that you are in labor. You may have practice contractions called Braxton Hicks contractions. These false labor contractions are sometimes confused with true labor. What are Braxton Hicks contractions? Braxton Hicks contractions are tightening movements that occur in the muscles of the uterus before labor. Unlike true labor contractions, these contractions do not result in opening (dilation) and thinning of the cervix. Toward the end of pregnancy (32-34 weeks), Braxton Hicks contractions can happen more often and may become stronger. These contractions are sometimes difficult to tell apart from true labor because they can be very uncomfortable. You should not feel embarrassed if you go to the hospital with false labor. Sometimes, the only way to tell if you are in true labor is for your health care provider to look for changes in the cervix. The health care provider will do a physical exam and may monitor your contractions. If you are not in true labor, the exam should show that your cervix is not dilating and your water has not broken. If there are no other health problems associated with your pregnancy, it is completely safe for you to be sent home with false labor. You may continue to have Braxton Hicks contractions until you go into true labor. How to tell the difference between true labor and false labor True labor  Contractions last 30-70 seconds.  Contractions become very regular.  Discomfort is usually felt in the top of the uterus, and it spreads to the lower abdomen and low back.  Contractions do not go away with walking.  Contractions usually become more intense and increase in frequency.  The cervix dilates and gets thinner. False labor  Contractions are usually shorter and not as strong as true labor contractions.  Contractions are usually irregular.  Contractions  are often felt in the front of the lower abdomen and in the groin.  Contractions may go away when you walk around or change positions while lying down.  Contractions get weaker and are shorter-lasting as time goes on.  The cervix usually does not dilate or become thin. Follow these instructions at home:   Take over-the-counter and prescription medicines only as told by your health care provider.  Keep up with your usual exercises and follow other instructions from your health care provider.  Eat and drink lightly if you think you are going into labor.  If Braxton Hicks contractions are making you uncomfortable: ? Change your position from lying down or resting to walking, or change from walking to resting. ? Sit and rest in a tub of warm water. ? Drink enough fluid to keep your urine pale yellow. Dehydration may cause these contractions. ? Do slow and deep breathing several times an hour.  Keep all follow-up prenatal visits as told by your health care provider. This is important. Contact a health care provider if:  You have a fever.  You have continuous pain in your abdomen. Get help right away if:  Your contractions become stronger, more regular, and closer together.  You have fluid leaking or gushing from your vagina.  You pass blood-tinged mucus (bloody show).  You have bleeding from your vagina.  You have low back pain that you never had before.  You feel your baby's head pushing down and causing pelvic pressure.  Your baby is not moving inside you as much as it used to. Summary  Contractions that occur before labor are   called Braxton Hicks contractions, false labor, or practice contractions.  Braxton Hicks contractions are usually shorter, weaker, farther apart, and less regular than true labor contractions. True labor contractions usually become progressively stronger and regular, and they become more frequent.  Manage discomfort from Braxton Hicks contractions  by changing position, resting in a warm bath, drinking plenty of water, or practicing deep breathing. This information is not intended to replace advice given to you by your health care provider. Make sure you discuss any questions you have with your health care provider. Document Released: 08/18/2016 Document Revised: 03/17/2017 Document Reviewed: 08/18/2016 Elsevier Patient Education  2020 Elsevier Inc.  

## 2019-01-23 ENCOUNTER — Inpatient Hospital Stay: Payer: BC Managed Care – PPO | Admitting: Registered Nurse

## 2019-01-23 ENCOUNTER — Inpatient Hospital Stay
Admission: EM | Admit: 2019-01-23 | Discharge: 2019-01-25 | DRG: 807 | Disposition: A | Discharge status: Home / Self Care | Payer: BC Managed Care – PPO | Attending: Obstetrics and Gynecology | Admitting: Obstetrics and Gynecology

## 2019-01-23 DIAGNOSIS — Z3A39 39 weeks gestation of pregnancy: Secondary | ICD-10-CM | POA: Diagnosis not present

## 2019-01-23 DIAGNOSIS — O26893 Other specified pregnancy related conditions, third trimester: Secondary | ICD-10-CM | POA: Diagnosis present

## 2019-01-23 DIAGNOSIS — O134 Gestational [pregnancy-induced] hypertension without significant proteinuria, complicating childbirth: Principal | ICD-10-CM | POA: Diagnosis present

## 2019-01-23 DIAGNOSIS — Z20828 Contact with and (suspected) exposure to other viral communicable diseases: Secondary | ICD-10-CM | POA: Diagnosis present

## 2019-01-23 LAB — CBC
HCT: 37.8 % (ref 36.0–46.0)
Hemoglobin: 12.7 g/dL (ref 12.0–15.0)
MCH: 31.6 pg (ref 26.0–34.0)
MCHC: 33.6 g/dL (ref 30.0–36.0)
MCV: 94 fL (ref 80.0–100.0)
Platelets: 345 10*3/uL (ref 150–400)
RBC: 4.02 MIL/uL (ref 3.87–5.11)
RDW: 12.8 % (ref 11.5–15.5)
WBC: 23.2 10*3/uL — ABNORMAL HIGH (ref 4.0–10.5)
nRBC: 0 % (ref 0.0–0.2)

## 2019-01-23 LAB — TYPE AND SCREEN
ABO/RH(D): O POS
Antibody Screen: NEGATIVE

## 2019-01-23 LAB — SARS CORONAVIRUS 2 BY RT PCR (HOSPITAL ORDER, PERFORMED IN ~~LOC~~ HOSPITAL LAB): SARS Coronavirus 2: NEGATIVE

## 2019-01-23 LAB — RPR: RPR Ser Ql: NONREACTIVE

## 2019-01-23 MED ORDER — OXYTOCIN 40 UNITS IN NORMAL SALINE INFUSION - SIMPLE MED
2.5000 [IU]/h | INTRAVENOUS | Status: DC
  Administered 2019-01-23: 2.5 [IU]/h via INTRAVENOUS

## 2019-01-23 MED ORDER — PRENATAL MULTIVITAMIN CH
1.0000 | ORAL_TABLET | Freq: Every day | ORAL | Status: DC
  Administered 2019-01-23 – 2019-01-24 (×2): 1 via ORAL
  Filled 2019-01-23 (×2): qty 1

## 2019-01-23 MED ORDER — WITCH HAZEL-GLYCERIN EX PADS: 1.0000 "application " | MEDICATED_PAD | CUTANEOUS | Status: DC | PRN

## 2019-01-23 MED ORDER — FENTANYL 2.5 MCG/ML W/ROPIVACAINE 0.15% IN NS 100 ML EPIDURAL (ARMC)
EPIDURAL | Status: AC
  Filled 2019-01-23: qty 100

## 2019-01-23 MED ORDER — OXYTOCIN 10 UNIT/ML IJ SOLN
INTRAMUSCULAR | Status: AC
  Filled 2019-01-23: qty 2

## 2019-01-23 MED ORDER — FENTANYL CITRATE (PF) 100 MCG/2ML IJ SOLN: 50.0000 ug | INTRAMUSCULAR | Status: DC | PRN

## 2019-01-23 MED ORDER — DIPHENHYDRAMINE HCL 25 MG PO CAPS: 25.0000 mg | ORAL_CAPSULE | Freq: Four times a day (QID) | ORAL | Status: DC | PRN

## 2019-01-23 MED ORDER — ACETAMINOPHEN 325 MG PO TABS: 650.0000 mg | ORAL_TABLET | ORAL | Status: DC | PRN

## 2019-01-23 MED ORDER — IBUPROFEN 600 MG PO TABS
600.0000 mg | ORAL_TABLET | Freq: Four times a day (QID) | ORAL | Status: DC
  Administered 2019-01-23 – 2019-01-25 (×8): 600 mg via ORAL
  Filled 2019-01-23 (×8): qty 1

## 2019-01-23 MED ORDER — BENZOCAINE-MENTHOL 20-0.5 % EX AERO
1.0000 "application " | INHALATION_SPRAY | CUTANEOUS | Status: DC | PRN
  Administered 2019-01-23: 1 via TOPICAL
  Filled 2019-01-23: qty 56

## 2019-01-23 MED ORDER — LACTATED RINGERS IV SOLN
INTRAVENOUS | Status: DC
  Administered 2019-01-23: 08:00:00 via INTRAVENOUS

## 2019-01-23 MED ORDER — ONDANSETRON HCL 4 MG/2ML IJ SOLN: 4.0000 mg | INTRAMUSCULAR | Status: DC | PRN

## 2019-01-23 MED ORDER — MISOPROSTOL 200 MCG PO TABS
ORAL_TABLET | ORAL | Status: AC
  Filled 2019-01-23: qty 4

## 2019-01-23 MED ORDER — OXYTOCIN BOLUS FROM INFUSION
500.0000 mL | Freq: Once | INTRAVENOUS | Status: AC
  Administered 2019-01-23: 09:00:00 500 mL via INTRAVENOUS

## 2019-01-23 MED ORDER — LIDOCAINE HCL (PF) 1 % IJ SOLN
INTRAMUSCULAR | Status: AC
  Administered 2019-01-23: 09:00:00 30 mL via SUBCUTANEOUS
  Filled 2019-01-23: qty 30

## 2019-01-23 MED ORDER — OXYTOCIN 40 UNITS IN NORMAL SALINE INFUSION - SIMPLE MED
INTRAVENOUS | Status: AC
  Administered 2019-01-23: 500 mL via INTRAVENOUS
  Filled 2019-01-23: qty 1000

## 2019-01-23 MED ORDER — ONDANSETRON HCL 4 MG/2ML IJ SOLN: 4.0000 mg | Freq: Four times a day (QID) | INTRAMUSCULAR | Status: DC | PRN

## 2019-01-23 MED ORDER — ACETAMINOPHEN 325 MG PO TABS
650.0000 mg | ORAL_TABLET | ORAL | Status: DC | PRN
  Administered 2019-01-23 – 2019-01-24 (×3): 650 mg via ORAL
  Filled 2019-01-23 (×3): qty 2

## 2019-01-23 MED ORDER — COCONUT OIL OIL
1.0000 "application " | TOPICAL_OIL | Status: DC | PRN
  Filled 2019-01-23 (×2): qty 120

## 2019-01-23 MED ORDER — SOD CITRATE-CITRIC ACID 500-334 MG/5ML PO SOLN: 30.0000 mL | ORAL | Status: DC | PRN

## 2019-01-23 MED ORDER — ONDANSETRON 4 MG PO TBDP
4.0000 mg | ORAL_TABLET | Freq: Four times a day (QID) | ORAL | Status: DC
  Administered 2019-01-23: 05:00:00 4 mg via ORAL
  Filled 2019-01-23 (×3): qty 1

## 2019-01-23 MED ORDER — SIMETHICONE 80 MG PO CHEW: 80.0000 mg | CHEWABLE_TABLET | ORAL | Status: DC | PRN

## 2019-01-23 MED ORDER — DOCUSATE SODIUM 100 MG PO CAPS
100.0000 mg | ORAL_CAPSULE | Freq: Two times a day (BID) | ORAL | Status: DC
  Administered 2019-01-23 – 2019-01-25 (×4): 100 mg via ORAL
  Filled 2019-01-23 (×4): qty 1

## 2019-01-23 MED ORDER — ONDANSETRON HCL 4 MG PO TABS: 4.0000 mg | ORAL_TABLET | ORAL | Status: DC | PRN

## 2019-01-23 MED ORDER — ONDANSETRON 4 MG PO TBDP
ORAL_TABLET | ORAL | Status: AC
  Administered 2019-01-23: 05:00:00 4 mg via ORAL
  Filled 2019-01-23: qty 1

## 2019-01-23 MED ORDER — AMMONIA AROMATIC IN INHA
RESPIRATORY_TRACT | Status: AC
  Filled 2019-01-23: qty 10

## 2019-01-23 MED ORDER — LIDOCAINE HCL (PF) 1 % IJ SOLN
30.0000 mL | INTRAMUSCULAR | Status: AC | PRN
  Administered 2019-01-23: 09:00:00 30 mL via SUBCUTANEOUS

## 2019-01-23 MED ORDER — LACTATED RINGERS IV SOLN: 500.0000 mL | INTRAVENOUS | Status: DC | PRN

## 2019-01-23 MED ORDER — DIBUCAINE (PERIANAL) 1 % EX OINT: 1.0000 "application " | TOPICAL_OINTMENT | CUTANEOUS | Status: DC | PRN

## 2019-01-23 NOTE — H&P (Signed)
Obstetric History and Physical  Jadamarie Butson is a 28 y.o. G1P0000 with IUP at [redacted]w[redacted]d presenting in active labor. Patient states she has been having  irregular, every 2-3 minutes contractions, minimal vaginal bleeding, intact membranes, with active fetal movement.    Prenatal Course Source of Care: [facility]  Pregnancy complications or risks:gestational hypertension;   Prenatal labs and studies: ABO, Rh: O/Positive/-- (03/05 1618) Antibody: Negative (03/05 1618) Rubella: 4.35 (03/05 1618) RPR: Non Reactive (07/21 1005)  HBsAg: Negative (03/05 1618)  HIV: Non Reactive (03/05 1618)  GBS:--/Negative (09/15 1405) 1 hr Glucola  normal Genetic screening low risk female Anatomy US normal  Past Medical History:  Diagnosis Date  . Anxiety   . Depression   . Painful menstrual periods   . Vitamin D deficiency     History reviewed. No pertinent surgical history.  OB History  Gravida Para Term Preterm AB Living  1 0 0 0 0 0  SAB TAB Ectopic Multiple Live Births  0 0 0 0      # Outcome Date GA Lbr Len/2nd Weight Sex Delivery Anes PTL Lv  1 Current             Social History   Socioeconomic History  . Marital status: Significant Other    Spouse name: shawn   . Number of children: Not on file  . Years of education: Not on file  . Highest education level: Not on file  Occupational History  . Not on file  Social Needs  . Financial resource strain: Not hard at all  . Food insecurity    Worry: Never true    Inability: Never true  . Transportation needs    Medical: No    Non-medical: No  Tobacco Use  . Smoking status: Never Smoker  . Smokeless tobacco: Never Used  Substance and Sexual Activity  . Alcohol use: Not Currently    Comment: occas  . Drug use: No  . Sexual activity: Yes    Birth control/protection: Coitus interruptus, Abstinence    Comment: undecided  Lifestyle  . Physical activity    Days per week: 3 days    Minutes per session: 30 min  . Stress: Only a little   Relationships  . Social connections    Talks on phone: More than three times a week    Gets together: More than three times a week    Attends religious service: Never    Active member of club or organization: No    Attends meetings of clubs or organizations: Never    Relationship status: Living with partner  Other Topics Concern  . Not on file  Social History Narrative  . Not on file    Family History  Problem Relation Age of Onset  . Diabetes Maternal Aunt   . Cancer Mother   . Asthma Father     Medications Prior to Admission  Medication Sig Dispense Refill Last Dose  . pantoprazole (PROTONIX) 20 MG tablet Take 1 tablet (20 mg total) by mouth daily. 30 tablet 6 01/22/2019 at Unknown time  . Prenatal Vit-Fe Fumarate-FA (MULTIVITAMIN-PRENATAL) 27-0.8 MG TABS tablet Take 1 tablet by mouth daily at 12 noon.   01/22/2019 at Unknown time  . acetaminophen (TYLENOL) 500 MG tablet Take 2 tablets (1,000 mg total) by mouth every 6 (six) hours as needed for fever or headache. 30 tablet 0     Allergies  Allergen Reactions  . Latex     Review of Systems: Negative except  for what is mentioned in HPI.  Physical Exam: BP (!) 153/94 (BP Location: Right Arm)   Pulse 87   Temp 98.3 F (36.8 C) (Oral)   Resp 17   Ht 5\' 7"  (1.702 m)   Wt 112.5 kg   LMP 04/24/2018   BMI 38.84 kg/m  GENERAL: Well-developed, well-nourished female in no acute distress.  LUNGS: Clear to auscultation bilaterally.  HEART: Regular rate and rhythm. ABDOMEN: Soft, nontender, nondistended, gravid. EXTREMITIES: Nontender, no edema, 2+ distal pulses. Cervical Exam: Dilation: 4 Effacement (%): 80, 90 Cervical Position: Anterior Station: -1 Presentation: Vertex Exam by:: M.Newton, RN FHT:  Baseline rate 144 bpm   Variability moderate  Accelerations present   Decelerations none Contractions: Every 2-4 mins, moderate to palpation   Pertinent Labs/Studies:   Results for orders placed or performed in visit on  01/22/19 (from the past 24 hour(s))  POC Urinalysis Dipstick OB     Status: None   Collection Time: 01/22/19  2:26 PM  Result Value Ref Range   Color, UA yellow    Clarity, UA clear    Glucose, UA Negative Negative   Bilirubin, UA neg    Ketones, UA neg    Spec Grav, UA 1.015 1.010 - 1.025   Blood, UA neg    pH, UA 6.0 5.0 - 8.0   POC,PROTEIN,UA Negative Negative, Trace, Small (1+), Moderate (2+), Large (3+), 4+   Urobilinogen, UA 0.2 0.2 or 1.0 E.U./dL   Nitrite, UA neg    Leukocytes, UA Negative Negative   Appearance     Odor      Assessment : Ryeleigh Santore is a 28 y.o. G1P0000 at [redacted]w[redacted]d being admitted for labor.  Plan: Labor: Expectant management.  Induction/Augmentation as needed, per protocol FWB: Reassuring fetal heart tracing.  GBS negative Delivery plan: Hopeful for vaginal delivery  Breslin Burklow, CNM [facility], [facility]

## 2019-01-23 NOTE — OB Triage Note (Signed)
Patient came in for observation for labor evaluation. Patient reports regular uterine contractions every one to four minutes since 2300 last night. Patient reports +FM on arrival. Patient denies leaking of fluid and denies vaginal bleeding and spotting. Vital signs stable and patient afebrile. FHR baseline 130 with moderate variability with accelerations 15 x 15 and no decelerations. Significant other at bedside. Will continue to monitor.

## 2019-01-23 NOTE — Lactation Note (Signed)
This note was copied from a baby's chart. Lactation Consultation Note  Patient Name: Isabella Weaver [id number] Date: 01/23/2019 Reason for consult: Initial assessment;1st time breastfeeding  LC called in for assistance with first breastfeeding post delivery. Baby Isabella Weaver was in cradle position, tummy to tummy at mom's left breast . At first baby was licking at the breast. LC notes minimal breast tissue that is not easily compressed, unable to achieve a sandwich or tea-cup hold to assist with Isabella Weaver latching. Mom voices no history with breast trauma or health history to impact breastfeeding. [place] attempted to assist with latch but Isabella Weaver appeared uninterested, and content just being skin to skin with mom. LC and mom discussed breastfeeding basics, early hunger cues, signs of fullness, newborn stomach size and typical feeding patterns. LC suggests use of tools to assist with next feed, and will benefit from beginning a pumping regimen to help establish a milk supply. LC to be called at next feeding attempt.  Maternal Data Formula Feeding for Exclusion: No Has patient been taught Hand Expression?: Yes Does the patient have breastfeeding experience prior to this delivery?: No  Feeding Feeding Type: Breast Fed  LATCH Score                   Interventions Interventions: Breast feeding basics reviewed;Assisted with latch;Skin to skin;Breast massage;Adjust position  Lactation Tools Discussed/Used     Consult Status Consult Status: Follow-up Date: 01/23/19 Follow-up type: In-patient    Isabella Weaver 01/23/2019, 10:45 AM

## 2019-01-23 NOTE — Lactation Note (Signed)
This note was copied from a baby's chart. Lactation Consultation Note  Patient Name: Isabella Weaver Date: 01/23/2019 Reason for consult: Follow-up assessment;1st time breastfeeding  LC followed up with mom and baby Xena once moved to MB Unit. Baby had not attempted to feed since post-delivery (6 hours prior), and RN had encouraged putting baby skin to skin to wake and encourage a feeding. Baby appeared content lying with mom skin to skin, non-cuing, and asleep. [place] spoke with mom about feeding positions, newborn feeding patterns, signs of fullness with open/relaxed hands, and benefits of skin to skin. Discussed attempting a feed with baby to see how she did. Mom agreed. LC assisted in positioning baby in football hold on right breast. The right breast has less and minimal breast tissue than the left.  LC attempted to latch with use of tea cup hold of tissue, baby began rooting, but unable to sustain latch. Nipple shield given to help baby have more area to grab hold, unsuccessful with achieving a latch, baby fell asleep. Attempts also made in cradle position on right breast with use of nipple shield, baby again appeared content just being skin to skin, displayed wide-open hands, and had no early hunger cues given.  LC provided mom with DEBP and kit. Explained how to assemble the parts/pieces, use of coconut oil to minimize friction, and pumping routine of every 2-3 hours of stimulation and added effort to build a plentiful milk supply due to the lack of breast tissue. Mom was open to all avenues of taking care of baby. When asked by the RN, if supplement was needed would that be ok, mom agreed, asking for a sensitive option due to family hx of milk sensitivities.  Mom felt confident in her ability to begin and maintain a pumping schedule as well as continued attempts at getting baby to the breast on early hunger cues. She will call out for assistance this evening and over night as  needed.  Maternal Data Formula Feeding for Exclusion: No Has patient been taught Hand Expression?: Yes Does the patient have breastfeeding experience prior to this delivery?: No  Feeding    LATCH Score Latch: Too sleepy or reluctant, no latch achieved, no sucking elicited.  Audible Swallowing: None  Type of Nipple: Everted at rest and after stimulation  Comfort (Breast/Nipple): Soft / non-tender  Hold (Positioning): Assistance needed to correctly position infant at breast and maintain latch.  LATCH Score: 5  Interventions Interventions: Breast feeding basics reviewed;Assisted with latch;Skin to skin;Breast compression;Adjust position;Support pillows;Position options;Coconut oil;DEBP  Lactation Tools Discussed/Used Tools: Nipple Shields;Coconut oil;Pump Nipple shield size: 16 Breast pump type: Double-Electric Breast Pump Pump Review: Setup, frequency, and cleaning;Milk Storage Initiated by:: Gerome Sam, MPH, IBCLC Date initiated:: 01/23/19   Consult Status Consult Status: Follow-up Date: 01/23/19 Follow-up type: In-patient    Lavonia Drafts 01/23/2019, 3:35 PM

## 2019-01-24 LAB — CBC
HCT: 32.2 % — ABNORMAL LOW (ref 36.0–46.0)
Hemoglobin: 10.6 g/dL — ABNORMAL LOW (ref 12.0–15.0)
MCH: 32 pg (ref 26.0–34.0)
MCHC: 32.9 g/dL (ref 30.0–36.0)
MCV: 97.3 fL (ref 80.0–100.0)
Platelets: 285 10*3/uL (ref 150–400)
RBC: 3.31 MIL/uL — ABNORMAL LOW (ref 3.87–5.11)
RDW: 13.2 % (ref 11.5–15.5)
WBC: 18.7 10*3/uL — ABNORMAL HIGH (ref 4.0–10.5)
nRBC: 0 % (ref 0.0–0.2)

## 2019-01-24 NOTE — Lactation Note (Signed)
This note was copied from a baby's chart. Lactation Consultation Note  Patient Name: Girl Jaeanna Mccomber HYIFO'Y Date: 01/24/2019   Mom has small nipple/areola that points downward and wide spaced breasts with small amount of breast tissue.  Was able to hand express colostrum.  Demonstrated how to massage breast to keep Xena actively sucking at the breast.  Mom has Symphony set up in room for stimulation when Xena was not latching to the breast.  She is latching with minimal assistance now with strong rhythmic sucking and occasional swallows.  She is no longer needing nipple shield.  Small blister noted on left nipple so started sucking on right breast first.  When went to left breast, mom reports some tenderness on initial latch, but tolerable.  Hand expressed colsotrum to rub on nipples after breast feed.  Coconut oil and comfort gels given and instructed in alternating use.  Explained feeding cues, normal newborn stomach size, supply and demand, normal course of lactation and routine newborn feeding patterns.  Lactation name and number written on white board and encouraged to call with any questions, concerns or assistance.      Maternal Data    Feeding Feeding Type: Breast Fed  LATCH Score                   Interventions    Lactation Tools Discussed/Used     Consult Status      Jarold Motto 01/24/2019, 8:23 PM

## 2019-01-24 NOTE — Progress Notes (Signed)
Post Partum Day 1 Subjective: no complaints, up ad lib and voiding  Objective: Blood pressure (!) 127/93, pulse 77, temperature 98.1 F (36.7 C), temperature source Oral, resp. rate 16, height 5\' 7"  (1.702 m), weight 112.5 kg, last menstrual period 04/24/2018, SpO2 100 %, unknown if currently breastfeeding.  Physical Exam:  General: alert, cooperative, appears stated age, fatigued, moderately obese and pale Lochia: appropriate Uterine Fundus: firm Incision: NA DVT Evaluation: No evidence of DVT seen on physical exam. Negative Homan's sign.  Recent Labs    01/23/19 0747 01/24/19 0531  HGB 12.7 10.6*  HCT 37.8 32.2*    Assessment/Plan: Plan for discharge tomorrow and Breastfeeding Infant feeding Breast/Bottle; Both    LOS: 1 day   Melody N Shambley 01/24/2019, 8:18 AM

## 2019-01-25 ENCOUNTER — Other Ambulatory Visit: Payer: BC Managed Care – PPO

## 2019-01-25 ENCOUNTER — Encounter: Payer: BC Managed Care – PPO | Admitting: Certified Nurse Midwife

## 2019-01-25 MED ORDER — IBUPROFEN 600 MG PO TABS: 600.0000 mg | ORAL_TABLET | Freq: Four times a day (QID) | ORAL | 0 refills | Status: DC

## 2019-01-25 NOTE — Progress Notes (Signed)
Patient discharged home with infant. Discharge instructions and prescriptions given and reviewed with patient. Patient verbalized understanding. Escorted out by staff.  

## 2019-01-25 NOTE — Discharge Instructions (Signed)

## 2019-01-25 NOTE — Anesthesia Preprocedure Evaluation (Signed)
Anesthesia Evaluation  Patient identified by MRN, date of birth, ID band Patient awake    Reviewed: Allergy & Precautions, H&P , NPO status , Patient's Chart, lab work & pertinent test results, reviewed documented beta blocker date and time   Airway Mallampati: II  TM Distance: >3 FB Neck ROM: full    Dental no notable dental hx. (+) Teeth Intact   Pulmonary neg pulmonary ROS, Current Smoker and Patient abstained from smoking.,    Pulmonary exam normal breath sounds clear to auscultation       Cardiovascular Exercise Tolerance: Good negative cardio ROS   Rhythm:regular Rate:Normal     Neuro/Psych negative neurological ROS  negative psych ROS   GI/Hepatic Neg liver ROS, GERD  Medicated,  Endo/Other  negative endocrine ROSdiabetes, Well Controlled, Type 2, Oral Hypoglycemic Agents  Renal/GU      Musculoskeletal   Abdominal   Peds  Hematology negative hematology ROS (+)   Anesthesia Other Findings   Reproductive/Obstetrics (+) Pregnancy                             Anesthesia Physical Anesthesia Plan  ASA: II  Anesthesia Plan: Epidural   Post-op Pain Management:    Induction:   PONV Risk Score and Plan:   Airway Management Planned:   Additional Equipment:   Intra-op Plan:   Post-operative Plan:   Informed Consent: I have reviewed the patients History and Physical, chart, labs and discussed the procedure including the risks, benefits and alternatives for the proposed anesthesia with the patient or authorized representative who has indicated his/her understanding and acceptance.       Plan Discussed with:   Anesthesia Plan Comments:         Anesthesia Quick Evaluation

## 2019-01-25 NOTE — Discharge Summary (Signed)
Physician Obstetric Discharge Summary  Patient ID: Isabella Weaver MRN: [id number] DOB/AGE: July 27, 1990 28 y.o.   Date of Admission: 01/23/2019  Date of Discharge: 01/25/2019  Admitting Diagnosis: Onset of Labor at [redacted]w[redacted]d  Secondary Diagnosis: Gestational hypertension  Mode of Delivery: normal spontaneous vaginal delivery     Discharge Diagnosis: No other diagnosis   Intrapartum Procedures: None   Post partum procedures: None  Complications: Second degree perineal laceration, repaired   Brief Hospital Course   Isabella Weaver is a [id number] who had a SVD on 01/23/2019;  for further details of this birth, please refer to the delivey summary.  Patient had an uncomplicated postpartum course.  By time of discharge on PPD#2, her pain was controlled on oral pain medications; she had appropriate lochia and was ambulating, voiding without difficulty and tolerating regular diet.  She was deemed stable for discharge to home.     Labs: CBC Latest Ref Rng & Units 01/24/2019 01/23/2019 12/20/2018  WBC 4.0 - 10.5 K/uL 18.7(H) 23.2(H) 13.0(H)  Hemoglobin 12.0 - 15.0 g/dL 10.6(L) 12.7 11.9(L)  Hematocrit 36.0 - 46.0 % 32.2(L) 37.8 34.9(L)  Platelets 150 - 400 K/uL 285 345 294   O POS  Physical exam:   Blood pressure 113/68, pulse 83, temperature 98.4 F (36.9 C), temperature source Oral, resp. rate 18, height 5\' 7"  (1.702 m), weight 112.5 kg, last menstrual period 04/24/2018, SpO2 100 %, unknown if currently breastfeeding.  General: alert and no distress  Lochia: appropriate  Abdomen: soft, NT  Uterine Fundus: firm  Laceration: healing well, no significant drainage, no dehiscence, no significant erythema  Extremities: No evidence of DVT seen on physical exam. No lower extremity edema.  Discharge Instructions: Per After Visit Summary.  Activity: Advance as tolerated. Pelvic rest for 6 weeks.  Also refer to After Visit Summary  Diet: Regular  Medications: Allergies as of 01/25/2019       Reactions   Latex       Medication List    TAKE these medications   acetaminophen 500 MG tablet Commonly known as: TYLENOL Take 2 tablets (1,000 mg total) by mouth every 6 (six) hours as needed for fever or headache.   ibuprofen 600 MG tablet Commonly known as: ADVIL Take 1 tablet (600 mg total) by mouth every 6 (six) hours.   multivitamin-prenatal 27-0.8 MG Tabs tablet Take 1 tablet by mouth daily at 12 noon.   pantoprazole 20 MG tablet Commonly known as: Protonix Take 1 tablet (20 mg total) by mouth daily.      Outpatient follow up:  Follow-up Information    [facility] Follow up.   Why: Blood pressure check on Monday Contact information: [place]  [place] [phone]       Isabella Weaver, [place]. Call in 6 week(s).   Specialties: Obstetrics and Gynecology, Radiology Why: Postpartum visit with [facility] Contact information: [address] [place] [address] [phone]          Postpartum contraception: IUD, ParaGard  Discharged Condition: stable  Discharged to: home   Newborn Data:  Disposition:home with mother  Apgars: APGAR (1 MIN): 8   APGAR (5 MINS): 9    Baby Feeding: Breast   Isabella Weaver, CNM [facility], [facility] 01/25/19 3:11 PM

## 2019-01-28 ENCOUNTER — Ambulatory Visit (INDEPENDENT_AMBULATORY_CARE_PROVIDER_SITE_OTHER): Payer: BC Managed Care – PPO | Admitting: Certified Nurse Midwife

## 2019-01-28 ENCOUNTER — Other Ambulatory Visit: Payer: Self-pay

## 2019-01-28 VITALS — BP 132/98 | HR 89 | Ht 67.0 in | Wt 228.7 lb

## 2019-01-28 DIAGNOSIS — Z0130 Encounter for examination of blood pressure without abnormal findings: Secondary | ICD-10-CM

## 2019-01-28 DIAGNOSIS — Z8759 Personal history of other complications of pregnancy, childbirth and the puerperium: Secondary | ICD-10-CM

## 2019-01-28 MED ORDER — HYDROCHLOROTHIAZIDE 12.5 MG PO TABS: 25.0000 mg | ORAL_TABLET | Freq: Every day | ORAL | 0 refills | Status: DC

## 2019-01-28 NOTE — Patient Instructions (Signed)
Hydrochlorothiazide, HCTZ capsules or tablets What is this medicine? HYDROCHLOROTHIAZIDE (hye droe klor oh THYE a zide) is a diuretic. It increases the amount of urine passed, which causes the body to lose salt and water. This medicine is used to treat high blood pressure. It is also reduces the swelling and water retention caused by various medical conditions, such as heart, liver, or kidney disease. This medicine may be used for other purposes; ask your health care provider or pharmacist if you have questions. COMMON BRAND NAME(S): Esidrix, Ezide, HydroDIURIL, Microzide, Oretic, Zide What should I tell my health care provider before I take this medicine? They need to know if you have any of these conditions:  diabetes  gout  immune system problems, like lupus  kidney disease or kidney stones  liver disease  pancreatitis  small amount of urine or difficulty passing urine  an unusual or allergic reaction to hydrochlorothiazide, sulfa drugs, other medicines, foods, dyes, or preservatives  pregnant or trying to get pregnant  breast-feeding How should I use this medicine? Take this medicine by mouth with a glass of water. Follow the directions on the prescription label. Take your medicine at regular intervals. Remember that you will need to pass urine frequently after taking this medicine. Do not take your doses at a time of day that will cause you problems. Do not stop taking your medicine unless your doctor tells you to. Talk to your pediatrician regarding the use of this medicine in children. Special care may be needed. Overdosage: If you think you have taken too much of this medicine contact a poison control center or emergency room at once. NOTE: This medicine is only for you. Do not share this medicine with others. What if I miss a dose? If you miss a dose, take it as soon as you can. If it is almost time for your next dose, take only that dose. Do not take double or extra doses.  What may interact with this medicine?  cholestyramine  colestipol  digoxin  dofetilide  lithium  medicines for blood pressure  medicines for diabetes  medicines that relax muscles for surgery  other diuretics  steroid medicines like prednisone or cortisone This list may not describe all possible interactions. Give your health care provider a list of all the medicines, herbs, non-prescription drugs, or dietary supplements you use. Also tell them if you smoke, drink alcohol, or use illegal drugs. Some items may interact with your medicine. What should I watch for while using this medicine? Visit your doctor or health care professional for regular checks on your progress. Check your blood pressure as directed. Ask your doctor or health care professional what your blood pressure should be and when you should contact him or her. You may need to be on a special diet while taking this medicine. Ask your doctor. Check with your doctor or health care professional if you get an attack of severe diarrhea, nausea and vomiting, or if you sweat a lot. The loss of too much body fluid can make it dangerous for you to take this medicine. You may get drowsy or dizzy. Do not drive, use machinery, or do anything that needs mental alertness until you know how this medicine affects you. Do not stand or sit up quickly, especially if you are an older patient. This reduces the risk of dizzy or fainting spells. Alcohol may interfere with the effect of this medicine. Avoid alcoholic drinks. This medicine may increase blood sugar. Ask your healthcare provider  if changes in diet or medicines are needed if you have diabetes. This medicine can make you more sensitive to the sun. Keep out of the sun. If you cannot avoid being in the sun, wear protective clothing and use sunscreen. Do not use sun lamps or tanning beds/booths. What side effects may I notice from receiving this medicine? Side effects that you should  report to your doctor or health care professional as soon as possible:  allergic reactions such as skin rash or itching, hives, swelling of the lips, mouth, tongue, or throat  changes in vision  chest pain  eye pain  fast or irregular heartbeat  feeling faint or lightheaded, falls  gout attack  muscle pain or cramps  pain or difficulty when passing urine  pain, tingling, numbness in the hands or feet  redness, blistering, peeling or loosening of the skin, including inside the mouth   signs and symptoms of high blood sugar such as being more thirsty or hungry or having to urinate more than normal. You may also feel very tired or have blurry vision.  unusually weak Side effects that usually do not require medical attention (report to your doctor or health care professional if they continue or are bothersome):  change in sex drive or performance  dry mouth  headache  stomach upset This list may not describe all possible side effects. Call your doctor for medical advice about side effects. You may report side effects to FDA at 1-800-FDA-1088. Where should I keep my medicine? Keep out of the reach of children. Store at room temperature between 15 and 30 degrees C (59 and 86 degrees F). Do not freeze. Protect from light and moisture. Keep container closed tightly. Throw away any unused medicine after the expiration date. NOTE: This sheet is a summary. It may not cover all possible information. If you have questions about this medicine, talk to your doctor, pharmacist, or health care provider.  2020 Elsevier/Gold Standard (2018-01-24 09:25:06) Vaginal Delivery  Vaginal delivery means that you give birth by pushing your baby out of your birth canal (vagina). A team of health care providers will help you before, during, and after vaginal delivery. Birth experiences are unique for every woman and every pregnancy, and birth experiences vary depending on where you choose to give  birth. What happens when I arrive at the birth center or hospital? Once you are in labor and have been admitted into the hospital or birth center, your health care provider may:  Review your pregnancy history and any concerns that you have.  Insert an IV into one of your veins. This may be used to give you fluids and medicines.  Check your blood pressure, pulse, temperature, and heart rate (vital signs).  Check whether your bag of water (amniotic sac) has broken (ruptured).  Talk with you about your birth plan and discuss pain control options. Monitoring Your health care provider may monitor your contractions (uterine monitoring) and your baby's heart rate (fetal monitoring). You may need to be monitored:  Often, but not continuously (intermittently).  All the time or for long periods at a time (continuously). Continuous monitoring may be needed if: ? You are taking certain medicines, such as medicine to relieve pain or make your contractions stronger. ? You have pregnancy or labor complications. Monitoring may be done by:  Placing a special stethoscope or a handheld monitoring device on your abdomen to check your baby's heartbeat and to check for contractions.  Placing monitors on your abdomen (  external monitors) to record your baby's heartbeat and the frequency and length of contractions.  Placing monitors inside your uterus through your vagina (internal monitors) to record your baby's heartbeat and the frequency, length, and strength of your contractions. Depending on the type of monitor, it may remain in your uterus or on your baby's head until birth.  Telemetry. This is a type of continuous monitoring that can be done with external or internal monitors. Instead of having to stay in bed, you are able to move around during telemetry. Physical exam Your health care provider may perform frequent physical exams. This may include:  Checking how and where your baby is positioned in  your uterus.  Checking your cervix to determine: ? Whether it is thinning out (effacing). ? Whether it is opening up (dilating). What happens during labor and delivery?  Normal labor and delivery is divided into the following three stages: Stage 1  This is the longest stage of labor.  This stage can last for hours or days.  Throughout this stage, you will feel contractions. Contractions generally feel mild, infrequent, and irregular at first. They get stronger, more frequent (about every 2-3 minutes), and more regular as you move through this stage.  This stage ends when your cervix is completely dilated to 4 inches (10 cm) and completely effaced. Stage 2  This stage starts once your cervix is completely effaced and dilated and lasts until the delivery of your baby.  This stage may last from 20 minutes to 2 hours.  This is the stage where you will feel an urge to push your baby out of your vagina.  You may feel stretching and burning pain, especially when the widest part of your baby's head passes through the vaginal opening (crowning).  Once your baby is delivered, the umbilical cord will be clamped and cut. This usually occurs after waiting a period of 1-2 minutes after delivery.  Your baby will be placed on your bare chest (skin-to-skin contact) in an upright position and covered with a warm blanket. Watch your baby for feeding cues, like rooting or sucking, and help the baby to your breast for his or her first feeding. Stage 3  This stage starts immediately after the birth of your baby and ends after you deliver the placenta.  This stage may take anywhere from 5 to 30 minutes.  After your baby has been delivered, you will feel contractions as your body expels the placenta and your uterus contracts to control bleeding. What can I expect after labor and delivery?  After labor is over, you and your baby will be monitored closely until you are ready to go home to ensure that  you are both healthy. Your health care team will teach you how to care for yourself and your baby.  You and your baby will stay in the same room (rooming in) during your hospital stay. This will encourage early bonding and successful breastfeeding.  You may continue to receive fluids and medicines through an IV.  Your uterus will be checked and massaged regularly (fundal massage).  You will have some soreness and pain in your abdomen, vagina, and the area of skin between your vaginal opening and your anus (perineum).  If an incision was made near your vagina (episiotomy) or if you had some vaginal tearing during delivery, cold compresses may be placed on your episiotomy or your tear. This helps to reduce pain and swelling.  You may be given a squirt bottle to use  instead of wiping when you go to the bathroom. To use the squirt bottle, follow these steps: ? Before you urinate, fill the squirt bottle with warm water. Do not use hot water. ? After you urinate, while you are sitting on the toilet, use the squirt bottle to rinse the area around your urethra and vaginal opening. This rinses away any urine and blood. ? Fill the squirt bottle with clean water every time you use the bathroom.  It is normal to have vaginal bleeding after delivery. Wear a sanitary pad for vaginal bleeding and discharge. Summary  Vaginal delivery means that you will give birth by pushing your baby out of your birth canal (vagina).  Your health care provider may monitor your contractions (uterine monitoring) and your baby's heart rate (fetal monitoring).  Your health care provider may perform a physical exam.  Normal labor and delivery is divided into three stages.  After labor is over, you and your baby will be monitored closely until you are ready to go home. This information is not intended to replace advice given to you by your health care provider. Make sure you discuss any questions you have with your health  care provider. Document Released: 01/12/2008 Document Revised: 05/09/2017 Document Reviewed: 05/09/2017 Elsevier Patient Education  2020 Elsevier Inc. Preeclampsia and Eclampsia Preeclampsia is a serious condition that may develop during pregnancy. This condition causes high blood pressure and increased protein in your urine along with other symptoms, such as headaches and vision changes. These symptoms may develop as the condition gets worse. Preeclampsia may occur at 20 weeks of pregnancy or later. Diagnosing and treating preeclampsia early is very important. If not treated early, it can cause serious problems for you and your baby. One problem it can lead to is eclampsia. Eclampsia is a condition that causes muscle jerking or shaking (convulsions or seizures) and other serious problems for the mother. During pregnancy, delivering your baby may be the best treatment for preeclampsia or eclampsia. For most women, preeclampsia and eclampsia symptoms go away after giving birth. In rare cases, a woman may develop preeclampsia after giving birth (postpartum preeclampsia). This usually occurs within 48 hours after childbirth but may occur up to 6 weeks after giving birth. What are the causes? The cause of preeclampsia is not known. What increases the risk? The following risk factors make you more likely to develop preeclampsia:  Being pregnant for the first time.  Having had preeclampsia during a past pregnancy.  Having a family history of preeclampsia.  Having high blood pressure.  Being pregnant with more than one baby.  Being 55 or older.  Being African-American.  Having kidney disease or diabetes.  Having medical conditions such as lupus or blood diseases.  Being very overweight (obese). What are the signs or symptoms? The most common symptoms are:  Severe headaches.  Vision problems, such as blurred or double vision.  Abdominal pain, especially upper abdominal pain. Other  symptoms that may develop as the condition gets worse include:  Sudden weight gain.  Sudden swelling of the hands, face, legs, and feet.  Severe nausea and vomiting.  Numbness in the face, arms, legs, and feet.  Dizziness.  Urinating less than usual.  Slurred speech.  Convulsions or seizures. How is this diagnosed? There are no screening tests for preeclampsia. Your health care provider will ask you about symptoms and check for signs of preeclampsia during your prenatal visits. You may also have tests that include:  Checking your blood pressure.  Urine tests to check for protein. Your health care provider will check for this at every prenatal visit.  Blood tests.  Monitoring your baby's heart rate.  Ultrasound. How is this treated? You and your health care provider will determine the treatment approach that is best for you. Treatment may include:  Having more frequent prenatal exams to check for signs of preeclampsia, if you have an increased risk for preeclampsia.  Medicine to lower your blood pressure.  Staying in the hospital, if your condition is severe. There, treatment will focus on controlling your blood pressure and the amount of fluids in your body (fluid retention).  Taking medicine (magnesium sulfate) to prevent seizures. This may be given as an injection or through an IV.  Taking a low-dose aspirin during your pregnancy.  Delivering your baby early. You may have your labor started with medicine (induced), or you may have a cesarean delivery. Follow these instructions at home: Eating and drinking   Drink enough fluid to keep your urine pale yellow.  Avoid caffeine. Lifestyle  Do not use any products that contain nicotine or tobacco, such as cigarettes and e-cigarettes. If you need help quitting, ask your health care provider.  Do not use alcohol or drugs.  Avoid stress as much as possible. Rest and get plenty of sleep. General instructions  Take  over-the-counter and prescription medicines only as told by your health care provider.  When lying down, lie on your left side. This keeps pressure off your major blood vessels.  When sitting or lying down, raise (elevate) your feet. Try putting some pillows underneath your lower legs.  Exercise regularly. Ask your health care provider what kinds of exercise are best for you.  Keep all follow-up and prenatal visits as told by your health care provider. This is important. How is this prevented? There is no known way of preventing preeclampsia or eclampsia from developing. However, to lower your risk of complications and detect problems early:  Get regular prenatal care. Your health care provider may be able to diagnose and treat the condition early.  Maintain a healthy weight. Ask your health care provider for help managing weight gain during pregnancy.  Work with your health care provider to manage any long-term (chronic) health conditions you have, such as diabetes or kidney problems.  You may have tests of your blood pressure and kidney function after giving birth.  Your health care provider may have you take low-dose aspirin during your next pregnancy. Contact a health care provider if:  You have symptoms that your health care provider told you may require more treatment or monitoring, such as: ? Headaches. ? Nausea or vomiting. ? Abdominal pain. ? Dizziness. ? Light-headedness. Get help right away if:  You have severe: ? Abdominal pain. ? Headaches that do not get better. ? Dizziness. ? Vision problems. ? Confusion. ? Nausea or vomiting.  You have any of the following: ? A seizure. ? Sudden, rapid weight gain. ? Sudden swelling in your hands, ankles, or face. ? Trouble moving any part of your body. ? Numbness in any part of your body. ? Trouble speaking. ? Abnormal bleeding.  You faint. Summary  Preeclampsia is a serious condition that may develop during  pregnancy.  This condition causes high blood pressure and increased protein in your urine along with other symptoms, such as headaches and vision changes.  Diagnosing and treating preeclampsia early is very important. If not treated early, it can cause serious problems for you and your  baby.  Get help right away if you have symptoms that your health care provider told you to watch for. This information is not intended to replace advice given to you by your health care provider. Make sure you discuss any questions you have with your health care provider. Document Released: 04/01/2000 Document Revised: 12/05/2017 Document Reviewed: 11/09/2015 Elsevier Patient Education  2020 [employer]

## 2019-01-28 NOTE — Progress Notes (Signed)
GYN ENCOUNTER NOTE  Subjective:       Isabella Weaver is a 28 y.o. G90P1001 female here for blood pressure check at five (5) days postpartum spontaneous vaginal birth.   Pregnancy was complicated by third trimester gestational hypertension.   Patient reports fatigue, but no headache, blurred vision or epigastric pain.   Denies difficulty breathing or respiratory distress, chest pain, abdominal pain, excessive vaginal bleeding, dysuria, and leg pain or swelling.    Gynecologic History  No LMP recorded. (Menstrual status: Lactating).   Contraception: abstinence, plans ParaGard  Last Pap: 07/2018. Results were: normal  Obstetric History  OB History  Gravida Para Term Preterm AB Living  1 1 1  0 0 1  SAB TAB Ectopic Multiple Live Births  0 0 0 0 1    # Outcome Date GA Lbr Len/2nd Weight Sex Delivery Anes PTL Lv  1 Term 01/23/19 [redacted]w[redacted]d / 00:01 6 lb 12.3 oz (3.07 kg) F Vag-Spont None  LIV    Past Medical History:  Diagnosis Date  . Anxiety   . Depression   . Painful menstrual periods   . Vitamin D deficiency     History reviewed. No pertinent surgical history.  Current Outpatient Medications on File Prior to Visit  Medication Sig Dispense Refill  . acetaminophen (TYLENOL) 500 MG tablet Take 2 tablets (1,000 mg total) by mouth every 6 (six) hours as needed for fever or headache. 30 tablet 0  . ibuprofen (ADVIL) 600 MG tablet Take 1 tablet (600 mg total) by mouth every 6 (six) hours. 30 tablet 0  . pantoprazole (PROTONIX) 20 MG tablet Take 1 tablet (20 mg total) by mouth daily. 30 tablet 6  . Prenatal Vit-Fe Fumarate-FA (MULTIVITAMIN-PRENATAL) 27-0.8 MG TABS tablet Take 1 tablet by mouth daily at 12 noon.     No current facility-administered medications on file prior to visit.     Allergies  Allergen Reactions  . Latex     Social History   Socioeconomic History  . Marital status: Significant Other    Spouse name: shawn   . Number of children: Not on file  . Years  of education: Not on file  . Highest education level: Not on file  Occupational History  . Not on file  Social Needs  . Financial resource strain: Not hard at all  . Food insecurity    Worry: Never true    Inability: Never true  . Transportation needs    Medical: No    Non-medical: No  Tobacco Use  . Smoking status: Never Smoker  . Smokeless tobacco: Never Used  Substance and Sexual Activity  . Alcohol use: Not Currently    Comment: occas  . Drug use: No  . Sexual activity: Not Currently    Birth control/protection: Coitus interruptus, Abstinence    Comment: undecided  Lifestyle  . Physical activity    Days per week: 3 days    Minutes per session: 30 min  . Stress: Only a little  Relationships  . Social connections    Talks on phone: More than three times a week    Gets together: More than three times a week    Attends religious service: Never    Active member of club or organization: No    Attends meetings of clubs or organizations: Never    Relationship status: Living with partner  . Intimate partner violence    Fear of current or ex partner: No    Emotionally abused: No  Physically abused: No    Forced sexual activity: No  Other Topics Concern  . Not on file  Social History Narrative  . Not on file    Family History  Problem Relation Age of Onset  . Diabetes Maternal Aunt   . Cancer Mother   . Asthma Father     The following portions of the patient's history were reviewed and updated as appropriate: allergies, current medications, past family history, past medical history, past social history, past surgical history and problem list.  Review of Systems  ROS negative except as noted above. Information obtained from patient.  Objective:   BP (!) 132/98   Pulse 89   Ht 5\' 7"  (1.702 m)   Wt 228 lb 11.2 oz (103.7 kg)   BMI 35.82 kg/m    CONSTITUTIONAL: Well-developed, well-nourished female in no acute distress.   MUSCULOSKELETAL: Normal range of  motion. No tenderness.  No cyanosis or clubbing. Trace lower extremity edema present.   Assessment:   1. Blood pressure check  2. History of gestational hypertension  Plan:   Rx: HCTZ, see orders.   Continue home blood pressure log, send results via MyChart on Friday.   Reviewed Weaver flag symptoms and when to call.   RTC as previously scheduled or sooner if needed.    Thursday, CNM [facility], [facility] 01/28/19 11:50 AM

## 2019-01-28 NOTE — Progress Notes (Signed)
Pt is present for bp check. Pt stated that she has been taking her bp at home regularly using a arm digital bp machine. Pt stated no swollen of the hands or feet. Pt's bp today was 132/98.

## 2019-01-30 ENCOUNTER — Encounter: Payer: BC Managed Care – PPO | Admitting: Certified Nurse Midwife

## 2019-01-30 ENCOUNTER — Other Ambulatory Visit: Payer: BC Managed Care – PPO

## 2019-01-30 NOTE — Anesthesia Postprocedure Evaluation (Signed)
Anesthesia Post Note  Patient: Isabella Weaver  Procedure(s) Performed: AN AD [place]  Patient location during evaluation: Mother Baby Anesthesia Type: General Level of consciousness: awake and alert Pain management: pain level controlled Vital Signs Assessment: post-procedure vital signs reviewed and stable Respiratory status: spontaneous breathing, nonlabored ventilation, respiratory function stable and patient connected to nasal cannula oxygen Cardiovascular status: blood pressure returned to baseline and stable Postop Assessment: no apparent nausea or vomiting Anesthetic complications: no     Last Vitals: There were no vitals filed for this visit.  Last Pain: There were no vitals filed for this visit.               Molli Barrows

## 2019-02-01 ENCOUNTER — Encounter: Payer: Self-pay | Admitting: Certified Nurse Midwife

## 2019-03-08 ENCOUNTER — Encounter: Payer: BC Managed Care – PPO | Admitting: Obstetrics and Gynecology

## 2019-03-11 ENCOUNTER — Other Ambulatory Visit: Payer: Self-pay

## 2019-03-11 ENCOUNTER — Ambulatory Visit (INDEPENDENT_AMBULATORY_CARE_PROVIDER_SITE_OTHER): Payer: Self-pay | Admitting: Certified Nurse Midwife

## 2019-03-11 ENCOUNTER — Encounter: Payer: Self-pay | Admitting: Certified Nurse Midwife

## 2019-03-11 MED ORDER — NORETHIN ACE-ETH ESTRAD-FE 1-20 MG-MCG PO TABS: 1.0000 | ORAL_TABLET | Freq: Every day | ORAL | 11 refills | Status: DC

## 2019-03-11 NOTE — Patient Instructions (Signed)
Preventive Care 21-26 Years Old, Female Preventive care refers to visits with your health care provider and lifestyle choices that can promote health and wellness. This includes:  A yearly physical exam. This may also be called an annual well check.  Regular dental visits and eye exams.  Immunizations.  Screening for certain conditions.  Healthy lifestyle choices, such as eating a healthy diet, getting regular exercise, not using drugs or products that contain nicotine and tobacco, and limiting alcohol use. What can I expect for my preventive care visit? Physical exam Your health care provider will check your:  Height and weight. This may be used to calculate body mass index (BMI), which tells if you are at a healthy weight.  Heart rate and blood pressure.  Skin for abnormal spots. Counseling Your health care provider may ask you questions about your:  Alcohol, tobacco, and drug use.  Emotional well-being.  Home and relationship well-being.  Sexual activity.  Eating habits.  Work and work environment.  Method of birth control.  Menstrual cycle.  Pregnancy history. What immunizations do I need?  Influenza (flu) vaccine  This is recommended every year. Tetanus, diphtheria, and pertussis (Tdap) vaccine  You may need a Td booster every 10 years. Varicella (chickenpox) vaccine  You may need this if you have not been vaccinated. Human papillomavirus (HPV) vaccine  If recommended by your health care provider, you may need three doses over 6 months. Measles, mumps, and rubella (MMR) vaccine  You may need at least one dose of MMR. You may also need a second dose. Meningococcal conjugate (MenACWY) vaccine  One dose is recommended if you are age 19-21 years and a first-year college student living in a residence hall, or if you have one of several medical conditions. You may also need additional booster doses. Pneumococcal conjugate (PCV13) vaccine  You may need  this if you have certain conditions and were not previously vaccinated. Pneumococcal polysaccharide (PPSV23) vaccine  You may need one or two doses if you smoke cigarettes or if you have certain conditions. Hepatitis A vaccine  You may need this if you have certain conditions or if you travel or work in places where you may be exposed to hepatitis A. Hepatitis B vaccine  You may need this if you have certain conditions or if you travel or work in places where you may be exposed to hepatitis B. Haemophilus influenzae type b (Hib) vaccine  You may need this if you have certain conditions. You may receive vaccines as individual doses or as more than one vaccine together in one shot (combination vaccines). Talk with your health care provider about the risks and benefits of combination vaccines. What tests do I need?  Blood tests  Lipid and cholesterol levels. These may be checked every 5 years starting at age 20.  Hepatitis C test.  Hepatitis B test. Screening  Diabetes screening. This is done by checking your blood sugar (glucose) after you have not eaten for a while (fasting).  Sexually transmitted disease (STD) testing.  BRCA-related cancer screening. This may be done if you have a family history of breast, ovarian, tubal, or peritoneal cancers.  Pelvic exam and Pap test. This may be done every 3 years starting at age 21. Starting at age 30, this may be done every 5 years if you have a Pap test in combination with an HPV test. Talk with your health care provider about your test results, treatment options, and if necessary, the need for more tests.   Follow these instructions at home: Eating and drinking   Eat a diet that includes fresh fruits and vegetables, whole grains, lean protein, and low-fat dairy.  Take vitamin and mineral supplements as recommended by your health care provider.  Do not drink alcohol if: ? Your health care provider tells you not to drink. ? You are  pregnant, may be pregnant, or are planning to become pregnant.  If you drink alcohol: ? Limit how much you have to 0-1 drink a day. ? Be aware of how much alcohol is in your drink. In the [place] one drink equals one 12 oz bottle of beer (355 mL), one 5 oz glass of wine (148 mL), or one 1 oz glass of hard liquor (44 mL). Lifestyle  Take daily care of your teeth and gums.  Stay active. Exercise for at least 30 minutes on 5 or more days each week.  Do not use any products that contain nicotine or tobacco, such as cigarettes, e-cigarettes, and chewing tobacco. If you need help quitting, ask your health care provider.  If you are sexually active, practice safe sex. Use a condom or other form of birth control (contraception) in order to prevent pregnancy and STIs (sexually transmitted infections). If you plan to become pregnant, see your health care provider for a preconception visit. What's next?  Visit your health care provider once a year for a well check visit.  Ask your health care provider how often you should have your eyes and teeth checked.  Stay up to date on all vaccines. This information is not intended to replace advice given to you by your health care provider. Make sure you discuss any questions you have with your health care provider. Document Released: 05/31/2001 Document Revised: 12/14/2017 Document Reviewed: 12/14/2017 Elsevier Patient Education  2020 Elsevier Inc.  

## 2019-03-11 NOTE — Progress Notes (Signed)
Subjective:    Isabella Weaver is a 28 y.o. G58P1001 Caucasian female who presents for a postpartum visit. She is 6 weeks postpartum following a spontaneous vaginal delivery at 39.1 gestational weeks. Anesthesia: local. I have fully reviewed the prenatal and intrapartum course. Postpartum course has been WNL. Baby's course has been WNL. Baby is feeding by bottle. Bleeding no bleeding. Bowel function is normal. Bladder function is normal. Patient is sexually active.  Contraception method is OCP (estrogen/progesterone). Postpartum depression screening: negative. Score 3.  Last pap 07/2018 and was negative.  The following portions of the patient's history were reviewed and updated as appropriate: allergies, current medications, past medical history, past surgical history and problem list.  Review of Systems Pertinent items are noted in HPI.   Vitals:   03/11/19 1501  BP: 118/80  Pulse: 90  Weight: 238 lb 1 oz (108 kg)  Height: 5\' 7"  (1.702 m)   Patient's last menstrual period was 03/01/2019 (exact date).  Objective:   General:  alert, cooperative and no distress   Breasts:  deferred, no complaints  Lungs: clear to auscultation bilaterally  Heart:  regular rate and rhythm  Abdomen: soft, nontender   Vulva: normal  Vagina: normal vagina  Cervix:  closed  Corpus: Well-involuted  Adnexa:  Non-palpable  Rectal Exam: no hemorrhoids        Assessment:   Postpartum exam 6 wks s/p SVD Bottle feeding Depression screening Contraception counseling   Plan:  : OCP (estrogen/progesterone), orders placed.  Follow up in: 1 yr for annual exam or earlier if needed  Philip Aspen, CNM

## 2019-04-01 ENCOUNTER — Other Ambulatory Visit: Payer: Self-pay | Admitting: Certified Nurse Midwife

## 2019-04-01 MED ORDER — NORETHIN-ETH ESTRAD-FE BIPHAS 1 MG-10 MCG / 10 MCG PO TABS: 1.0000 | ORAL_TABLET | Freq: Every day | ORAL | 11 refills | Status: DC

## 2019-04-01 NOTE — Progress Notes (Signed)
Pt request to change OCP to Lo loestrin due to mood side effects. Orders placed for treatment.    Philip Aspen, CNM

## 2020-03-10 ENCOUNTER — Other Ambulatory Visit: Payer: Self-pay

## 2020-03-10 ENCOUNTER — Encounter: Payer: Self-pay | Admitting: Certified Nurse Midwife

## 2020-03-10 ENCOUNTER — Ambulatory Visit (INDEPENDENT_AMBULATORY_CARE_PROVIDER_SITE_OTHER): Payer: Medicaid Other | Admitting: Certified Nurse Midwife

## 2020-03-10 VITALS — BP 135/92 | HR 83 | Ht 67.0 in | Wt 243.9 lb

## 2020-03-10 DIAGNOSIS — M25559 Pain in unspecified hip: Secondary | ICD-10-CM | POA: Diagnosis not present

## 2020-03-10 DIAGNOSIS — F32A Depression, unspecified: Secondary | ICD-10-CM

## 2020-03-10 DIAGNOSIS — Z13220 Encounter for screening for lipoid disorders: Secondary | ICD-10-CM

## 2020-03-10 DIAGNOSIS — Z01419 Encounter for gynecological examination (general) (routine) without abnormal findings: Secondary | ICD-10-CM

## 2020-03-10 DIAGNOSIS — Z1159 Encounter for screening for other viral diseases: Secondary | ICD-10-CM

## 2020-03-10 DIAGNOSIS — F419 Anxiety disorder, unspecified: Secondary | ICD-10-CM

## 2020-03-10 DIAGNOSIS — R5383 Other fatigue: Secondary | ICD-10-CM

## 2020-03-10 DIAGNOSIS — M25552 Pain in left hip: Secondary | ICD-10-CM

## 2020-03-10 MED ORDER — SERTRALINE HCL 50 MG PO TABS: 50.0000 mg | ORAL_TABLET | Freq: Every day | ORAL | 1 refills | Status: DC

## 2020-03-10 NOTE — Patient Instructions (Signed)

## 2020-03-10 NOTE — Progress Notes (Signed)
GYNECOLOGY ANNUAL PREVENTATIVE CARE ENCOUNTER NOTE  History:     Isabella Weaver is a 29 y.o. G48P1001 female here for a routine annual gynecologic exam.  Current complaints: anxiety and depression, hip pain, back pain and difficulty walking since giving birth. .   Denies abnormal vaginal bleeding, discharge, pelvic pain, problems with intercourse or other gynecologic concerns.     Social Relationship: in relationship with FOB Living: FOB and her daughter Work:none Exercise: occasional  Smoke/Alcohol/drug use: CPD to help sleep  Gynecologic History Patient's last menstrual period was 03/08/2020. Contraception: none, stopped states it effected her mood to much. Is not sexually active with her partner declines BC at this time.  Last Pap: 07/19/18. Results were: normal  Last mammogram: n/a   The pregnancy intention screening data noted above was reviewed. Potential methods of contraception were discussed. The patient elected to proceed with Abstinence.   Obstetric History OB History  Gravida Para Term Preterm AB Living  1 1 1  0 0 1  SAB TAB Ectopic Multiple Live Births  0 0 0 0 1    # Outcome Date GA Lbr Len/2nd Weight Sex Delivery Anes PTL Lv  1 Term 01/23/19 [redacted]w[redacted]d / 00:01 6 lb 12.3 oz (3.07 kg) F Vag-Spont None  LIV    Past Medical History:  Diagnosis Date  . Anxiety   . Depression   . Painful menstrual periods   . Vitamin D deficiency     No past surgical history on file.  Current Outpatient Medications on File Prior to Visit  Medication Sig Dispense Refill  . acetaminophen (TYLENOL) 500 MG tablet Take 2 tablets (1,000 mg total) by mouth every 6 (six) hours as needed for fever or headache. 30 tablet 0  . hydrochlorothiazide (HYDRODIURIL) 12.5 MG tablet Take 2 tablets (25 mg total) by mouth daily. (Patient not taking: Reported on 03/11/2019) 7 tablet 0  . ibuprofen (ADVIL) 600 MG tablet Take 1 tablet (600 mg total) by mouth every 6 (six) hours. (Patient not taking:  Reported on 03/11/2019) 30 tablet 0  . Norethindrone-Ethinyl Estradiol-Fe Biphas (LO LOESTRIN FE) 1 MG-10 MCG / 10 MCG tablet Take 1 tablet by mouth daily. 1 Package 11  . pantoprazole (PROTONIX) 20 MG tablet Take 1 tablet (20 mg total) by mouth daily. 30 tablet 6  . Prenatal Vit-Fe Fumarate-FA (MULTIVITAMIN-PRENATAL) 27-0.8 MG TABS tablet Take 1 tablet by mouth daily at 12 noon.     No current facility-administered medications on file prior to visit.    Allergies  Allergen Reactions  . Latex     Social History:  reports that she has never smoked. She has never used smokeless tobacco. She reports previous alcohol use. She reports that she does not use drugs.  Family History  Problem Relation Age of Onset  . Diabetes Maternal Aunt   . Cancer Mother   . Asthma Father     The following portions of the patient's history were reviewed and updated as appropriate: allergies, current medications, past family history, past medical history, past social history, past surgical history and problem list.  Review of Systems Pertinent items noted in HPI and remainder of comprehensive ROS otherwise negative.  Physical Exam:  BP (!) 135/92   Pulse 83   Ht 5\' 7"  (1.702 m)   Wt 243 lb 14.4 oz (110.6 kg)   LMP 03/08/2020   BMI 38.20 kg/m  CONSTITUTIONAL: Well-developed, well-nourished, over weight female in no acute distress.  HENT:  Normocephalic, atraumatic, External right and  left ear normal. Oropharynx is clear and moist EYES: Conjunctivae and EOM are normal. Pupils are equal, round, and reactive to light. No scleral icterus.  NECK: Normal range of motion, supple, no masses.  Normal thyroid.  SKIN: Skin is warm and dry. No rash noted. Not diaphoretic. No erythema. No pallor. MUSCULOSKELETAL: Normal range of motion. No tenderness.  No cyanosis, clubbing, or edema.  2+ distal pulses. NEUROLOGIC: Alert and oriented to person, place, and time. Normal reflexes, muscle tone coordination.   PSYCHIATRIC: Normal mood and affect. Normal behavior. Normal judgment and thought content. CARDIOVASCULAR: Normal heart rate noted, regular rhythm RESPIRATORY: Clear to auscultation bilaterally. Effort and breath sounds normal, no problems with respiration noted. BREASTS: Symmetric in size. No masses, tenderness, skin changes, nipple drainage, or lymphadenopathy bilaterally.  ABDOMEN: Soft, no distention noted.  No tenderness, rebound or guarding.  PELVIC: Normal appearing external genitalia and urethral meatus; normal appearing vaginal mucosa and cervix.  No abnormal discharge noted.  Pap smear not due  Normal uterine size, no other palpable masses, no uterine or adnexal tenderness.  .       Office Visit from 03/10/2020 in [facility]  PHQ-9 Total Score 15     GAD 7 : Generalized Anxiety Score 03/10/2020  Nervous, Anxious, on Edge 2  Control/stop worrying 2  Worry too much - different things 2  Trouble relaxing 2  Restless 1  Easily annoyed or irritable 3  Afraid - awful might happen 1  Total GAD 7 Score 13    Pt state she her partner developed cellulites that turned in back infection had skin graft, He has been depressed and out of work. She has been out of work , it is putting a strain on there relationship. She is stressed, anxious, tearful all the time, not coping well and request medication to help as well as counseling. She denies desire to hurt herself or others.   Assessment and Plan:    1. Women's annual routine gynecological examination Pap:not indicated Mammogram :n/a  Labs:Hep C, Lipid, TSH Refills/orders: zoloft 50 mg daily  Referral:PT for hip pain , behavioral health for counseling.  Routine preventative health maintenance measures emphasized. Please refer to After Visit Summary for other counseling recommendations.      Follow up 4-6 weeks for medication check.   Doreene Burke, CNM [facility],  [facility]

## 2020-03-11 LAB — LIPID PANEL
Chol/HDL Ratio: 3 ratio (ref 0.0–4.4)
Cholesterol, Total: 156 mg/dL (ref 100–199)
HDL: 52 mg/dL (ref >39)
LDL Chol Calc (NIH): 90 mg/dL (ref 0–99)
Triglycerides: 69 mg/dL (ref 0–149)
VLDL Cholesterol Cal: 14 mg/dL (ref 5–40)

## 2020-03-11 LAB — THYROID PANEL WITH TSH
Free Thyroxine Index: 2.2 (ref 1.2–4.9)
T3 Uptake Ratio: 25 % (ref 24–39)
T4, Total: 8.6 ug/dL (ref 4.5–12.0)
TSH: 1.46 u[IU]/mL (ref 0.450–4.500)

## 2020-03-11 LAB — HEPATITIS C ANTIBODY: Hep C Virus Ab: <0.1 s/co ratio (ref 0.0–0.9)

## 2020-04-03 ENCOUNTER — Ambulatory Visit: Payer: Medicaid Other | Attending: Certified Nurse Midwife

## 2020-04-03 ENCOUNTER — Other Ambulatory Visit: Payer: Self-pay

## 2020-04-03 DIAGNOSIS — M533 Sacrococcygeal disorders, not elsewhere classified: Secondary | ICD-10-CM | POA: Diagnosis present

## 2020-04-03 DIAGNOSIS — M357 Hypermobility syndrome: Secondary | ICD-10-CM | POA: Insufficient documentation

## 2020-04-03 DIAGNOSIS — M62838 Other muscle spasm: Secondary | ICD-10-CM | POA: Diagnosis present

## 2020-04-03 NOTE — Therapy (Signed)
Isabella Weaver [facility] SERVICES [address] [place], [place], [address] Phone: [phone]   Fax:  [phone]  Physical Therapy Evaluation  The patient has been informed of current processes in place at Outpatient Rehab to protect patients from Covid-19 exposure including social distancing, schedule modifications, and new cleaning procedures. After discussing their particular risk with a therapist based on the patient's personal risk factors, the patient has decided to proceed with in-person therapy.  Patient Details  Name: Isabella Weaver MRN: [id number] Date of Birth: 1990/05/11 No data recorded  Encounter Date: 04/03/2020   PT End of Session - 04/03/20 1153    Visit Number 1    Number of Visits 10    Date for PT Re-Evaluation 06/12/20    Authorization Type Medicaid    Authorization Time Period 04/03/20 through    Authorization - Visit Number 1    Authorization - Number of Visits 4    Progress Note Due on Visit 4    PT Start Time 1150    PT Stop Time 1230    PT Time Calculation (min) 40 min    Activity Tolerance Patient tolerated treatment well;No increased pain    Behavior During Therapy WFL for tasks assessed/performed           Past Medical History:  Diagnosis Date   Anxiety    Depression    Painful menstrual periods    Vitamin D deficiency     History reviewed. No pertinent surgical history.  There were no vitals filed for this visit.    Pelvic Floor Physical Therapy Evaluation and Assessment  SCREENING  Falls in last 6 mo: no  Red Flags:  Have you had any night sweats? Yes, started when she was pregnant in 2020 Unexplained weight loss? none Saddle anesthesia? none Unexplained changes in bowel or bladder habits? none  SUBJECTIVE  Patient reports: Has urinary issues, she gets urge incontinence that started ~ 6 months PP. She is having hip issues that started in the R hip before pregnancy but now it is more  in the L hip. When she first gave birth she felt like she had a hard time walking which she was not able to address at the time. Her L knee was hurt recently too. She has been more sedentary because of pain. Walking up stairs is bad. Walking on a flat surface her pain increases by ~ 5 min. Of walking.   Precautions:  Anxiety, Depression, Painful menstrual periods, believes that she may be autistic, was diagnosed with ADHD.  Social/Family/Vocational History:   Unemployed, looking for work. 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  Recent Procedures/Tests/Findings:  none  Obstetrical History: G1P1 had a spontaneous vaginal birth with 2 deg. Tear.   Gynecological History: H/o chlamydia. History of painful periods from the first menses.   Urinary History: UUI when her bladder is very full  Gastrointestinal History: No issues  Sexual activity/pain: none  Location of pain: L hip into LLE Current pain:  3/10  Max pain:  9/10 Least pain:  3/10 Nature of pain: dull ache with occasional sharp/stab.  Patient Goals: Not be afraid to move.   OBJECTIVE  Posture/Observations:  Sitting:  Standing:L PSIS high, L knee bent,  R handed, 5'7", over pronation. Posterior pelvic tilt. Rocking.   Palpation/Segmental Motion/Joint Play: TTP to R>L diaphragm/rectus. B adductors, L>R hip-flexors. B  Glute max, OI and lesser at B Piriformis.  Special tests:   Supine-to-long-sit: L anterior rotation  Beighton Score  Little Finger: L-1, R-1 Thumb:         L-1, R-1 Elbow:          L-1, R-1 Knee:            L-1, R-1 Touch Floor: Yes   No  Total: 9/9  Positive = > 6/9 for youth, >5/9 for adults   Range of Motion/Flexibilty:  Spine: R SB to knee, pain on L, R SB to knee, no pain. B ROT WNL  Hips:   Strength/MMT: Deferred to follow up LE MMT  LE MMT Left Right  Hip flex:  (L2) /5 /5  Hip ext: /5 /5  Hip abd: /5 /5  Hip add: /5 /5   Hip IR /5 /5  Hip ER /5 /5     Abdominal:  Palpation: TTP to R>L diaphragm Diastasis: none  Pelvic Floor External Exam: Deferred to follow-up Introitus Appears:  Skin integrity:  Palpation: Cough: Prolapse visible?: Scar mobility:  Internal Vaginal Exam: Strength (PERF):  Symmetry: Palpation: Prolapse:   Internal Rectal Exam: Strength (PERF): Symmetry: Palpation: Prolapse:   Gait Analysis:   Pelvic Floor Outcome Measures: FOTO PFDI Pain:46, Urinary Problem:54, PFDI Urinary:17      INTERVENTIONS THIS SESSION: Self-care: Educated on the structure and function of the pelvic floor in relation to their symptoms as well as the POC, and initial HEP in order to set patient expectations and understanding from which we will build on in the future sessions.  Total time: 40 min.                  Objective measurements completed on examination: See above findings.                 PT Short Term Goals - 04/03/20 1531      PT SHORT TERM GOAL #1   Title Patient will demonstrate improved pelvic alignment and balance of musculature surrounding the pelvis to facilitate decreased PFM spasms and decrease pelvic pain.    Baseline L anterior rotation    Time 5    Period Weeks    Status New    Target Date 05/08/20      PT SHORT TERM GOAL #2   Title Pt. will demonstrate appropriate implementation of urge suppression techniques and understandign of soda-can theory and bladder irritants to allow for decreased UUI.    Baseline Pt. having UUI occasionally when bladder very full.    Time 5    Period Weeks    Status New    Target Date 05/08/20             PT Long Term Goals - 04/03/20 1535      PT LONG TERM GOAL #1   Title Patient will report no episodes of UUI over the course of the prior two weeks to demonstrate improved functional ability.    Baseline Pt. having mild UUI when her bladder is very full    Time 10    Period Weeks    Status  New    Target Date 06/12/20      PT LONG TERM GOAL #2   Title Patient will describe pain no greater than 3/10 during Walking for 30 min. to demonstrate improved functional ability and ability to increase activity for overall improved health.    Baseline Pt. has increased pain after walking ~ 5 min. High pain is 9/10    Time 10  Period Weeks    Status New    Target Date 06/12/20      PT LONG TERM GOAL #3   Title Pt. will demonstrate decrease FOTO score by 10 points in each category to demonstrated improved function.    Baseline PFDI  Pain:46,  Urinary  Problem:54,  PFDI  Urinary:17    Time 10    Period Weeks    Status New    Target Date 06/12/20                  Plan - 04/03/20 1518    Clinical Impression Statement Pt. is a 29 y/o female who presents today with ceif c/o L hip pain and UUI. Her PMH is significant for 1 vaginal childbirth with grade 2 tear as well as axiety, depression, and painful menstrual periods. Her clinical exam revealed a flat-back posture as well as spasms surrounding her L>R hip, a L anterior innominate rotation, and gross hypermobility. She will benefit from skilled pelvic health PT to address the noted deficits and to continue to assess for and address any other potential causes of Sx.    Personal Factors and Comorbidities Comorbidity 2    Comorbidities Anxiety, Depression, Painful menstrual periods    Examination-Activity Limitations Continence;Locomotion Level;Squat;Stand;Stairs    Examination-Participation Restrictions Cleaning;Occupation    Stability/Clinical Decision Making Evolving/Moderate complexity    Clinical Decision Making Moderate    Rehab Potential Good    PT Frequency 1x / week    PT Duration Other (comment)   10 weeks   PT Treatment/Interventions ADLs/Self Care Home Management;Biofeedback;Electrical Stimulation;Moist Heat;Ultrasound;Traction;Gait training;Functional mobility training;Therapeutic activities;Neuromuscular  re-education;Therapeutic exercise;Stair training;Balance training;Patient/family education;Manual techniques;Scar mobilization;Dry needling;Passive range of motion;Taping;Spinal Manipulations;Joint Manipulations    PT Next Visit Plan TP release to R diaphragm, L hip flexors and MET correction for L anterior rotation. Teach self TP release for posterior hip and LB muscles.    PT Home Exercise Plan diaphragmatic breathing    Consulted and Agree with Plan of Care Patient           Patient will benefit from skilled therapeutic intervention in order to improve the following deficits and impairments:  Hypermobility,Difficulty walking,Increased muscle spasms,Decreased strength,Decreased activity tolerance,Pain,Obesity,Postural dysfunction  Visit Diagnosis: Sacrococcygeal disorders, not elsewhere classified  Hypermobility syndrome  Other muscle spasm     Problem List Patient Active Problem List   Diagnosis Date Noted   History of chlamydia 08/30/2018   Obesity 02/13/2015   Willa Rough DPT, ATC Willa Rough 04/03/2020, 3:44 PM  [facility] SERVICES [address] [place], [place], [address] Phone: [phone]   Fax:  [phone]  Name: Isabella Weaver MRN: [id number] Date of Birth: [birth year]

## 2020-04-07 ENCOUNTER — Other Ambulatory Visit: Payer: Self-pay

## 2020-04-07 ENCOUNTER — Ambulatory Visit (INDEPENDENT_AMBULATORY_CARE_PROVIDER_SITE_OTHER): Payer: Medicaid Other | Admitting: Certified Nurse Midwife

## 2020-04-07 ENCOUNTER — Encounter: Payer: Self-pay | Admitting: Certified Nurse Midwife

## 2020-04-07 DIAGNOSIS — Z79899 Other long term (current) drug therapy: Secondary | ICD-10-CM

## 2020-04-07 MED ORDER — SERTRALINE HCL 50 MG PO TABS: 50.0000 mg | ORAL_TABLET | Freq: Every day | ORAL | 5 refills | Status: DC

## 2020-04-07 NOTE — Patient Instructions (Signed)

## 2020-04-07 NOTE — Progress Notes (Signed)
Received transferred call from Isabella Weaver for a televisit. DOB as identifier. Patient states the medicine is working. PhQ9= 6; GAD= 0. Call transferred to Doreene Burke CNM for completion of televisit.

## 2020-04-07 NOTE — Progress Notes (Signed)
Virtual Visit via Telephone Note  I connected with Francine Graven on 04/07/20 at  2:00 PM EST by telephone and verified that I am speaking with the correct person using two identifiers.  Location: Patient: at home Provider: at office    I discussed the limitations, risks, security and privacy concerns of performing an evaluation and management service by telephone and the availability of in person appointments. I also discussed with the patient that there may be a patient responsible charge related to this service. The patient expressed understanding and agreed to proceed.   History of Present Illness: Pt was seen for annual exam and started on zoloft 50 mg PO daily for depression. She was experiencing some relationship strain due to medical issues with her partner. She was also having hip and back pain, difficulty walking since giving birth that was contributing to depression.    Observations/Objective: Edrick Oh better, state she is no longer stuck in her head, not having racing thoughts or feeling like she is in a fog. Can see the big picture.   GAD 7 : Generalized Anxiety Score 04/07/2020 03/10/2020  Nervous, Anxious, on Edge 0 2  Control/stop worrying 0 2  Worry too much - different things 0 2  Trouble relaxing 0 2  Restless 0 1  Easily annoyed or irritable 0 3  Afraid - awful might happen 0 1  Total GAD 7 Score 0 13   Flowsheet Row Office Visit from 04/07/2020 in [facility]  PHQ-9 Total Score 6     Her previous GAD was 13, PHQ9 15  She has seen PT has follow up appointment on Thursday. She has a twisted pelvis and hypermobility.  Psychiatrist has been in touch and she should be seeing them soon.   Assessment and Plan: Continue zoloft 50 mg daily   Follow Up Instructions: PRN or 1 yr for annual exam.   Doreene Burke, CNM    I discussed the assessment and treatment plan with the patient. The patient was provided an opportunity to ask questions and all were  answered. The patient agreed with the plan and demonstrated an understanding of the instructions.   The patient was advised to call back or seek an in-person evaluation if the symptoms worsen or if the condition fails to improve as anticipated.  I provided 8 minutes of non-face-to-face time during this encounter.   Doreene Burke, CNM

## 2020-04-09 ENCOUNTER — Other Ambulatory Visit: Payer: Self-pay

## 2020-04-09 ENCOUNTER — Ambulatory Visit: Payer: Medicaid Other

## 2020-04-09 DIAGNOSIS — M533 Sacrococcygeal disorders, not elsewhere classified: Secondary | ICD-10-CM

## 2020-04-09 DIAGNOSIS — M62838 Other muscle spasm: Secondary | ICD-10-CM

## 2020-04-09 DIAGNOSIS — M357 Hypermobility syndrome: Secondary | ICD-10-CM

## 2020-04-09 NOTE — Therapy (Signed)
[place] [facility] MAIN [facility] SERVICES [address] [place], [place], [address] Phone: [phone]   Fax:  [phone]  Physical Therapy Treatment  The patient has been informed of current processes in place at Outpatient Rehab to protect patients from Covid-19 exposure including social distancing, schedule modifications, and new cleaning procedures. After discussing their particular risk with a therapist based on the patient's personal risk factors, the patient has decided to proceed with in-person therapy.  Patient Details  Name: Isabella Weaver MRN: [id number] Date of Birth: 1990-06-09 No data recorded  Encounter Date: 04/09/2020   PT End of Session - 04/09/20 1645    Visit Number 2    Number of Visits 10    Date for PT Re-Evaluation 06/12/20    Authorization Type Medicaid    Authorization Time Period 04/03/20 through    Authorization - Visit Number 2    Authorization - Number of Visits 4    Progress Note Due on Visit 4    PT Start Time 1530    PT Stop Time 1630    PT Time Calculation (min) 60 min    Activity Tolerance Patient tolerated treatment well;No increased pain    Behavior During Therapy WFL for tasks assessed/performed           Past Medical History:  Diagnosis Date   Anxiety    Depression    Painful menstrual periods    Vitamin D deficiency     No past surgical history on file.  There were no vitals filed for this visit.   Pelvic Floor Physical Therapy Treatment Note  SCREENING  Changes in medications, allergies, or medical history?: no    SUBJECTIVE  Patient reports: No change since last session.  Precautions:  Anxiety, Depression, Painful menstrual periods, believes that she may be autistic, was diagnosed with ADHD.  Pain update:  Location of pain: L hip into LLE Current pain: 3/10  Max pain: 9/10 Least pain: 3/10 Nature of pain:dull ache with occasional sharp/stab.  **0/10 pain following  treatment.  Patient Goals: Not be afraid to move.   OBJECTIVE  Changes in: Posture/Observations:  L PSIS high, L knee bent  **following treatment, PSIS level, L knee straight in standing   Range of Motion/Flexibilty:    Strength/MMT:  LE MMT:  Pelvic floor:  Abdominal:   Palpation: TTP to B QL and Iliacus/Psoas.  Gait Analysis:  INTERVENTIONS THIS SESSION: Manual: Performed TP release and STM to B Iliacus to decrease spasm and pain and allow for improved balance of musculature for improved function and decreased symptoms.  Dry-needle: Performed TPDN with a .30x18mm needle and standard approach as described below to decrease spasm and pain and allow for improved balance of musculature for improved function and decreased symptoms.  Therex: Educated on and practiced side-stretch and hip-flexor stretch To maintain and improve muscle length and allow for improved balance of musculature for long-term symptom relief.  Total time: 60 min.                      Trigger Point Dry Needling - 04/09/20 0001    Consent Given? Yes    Education Handout Provided No    Muscles Treated Back/Hip Iliacus    Dry Needling Comments Bilateral    Iliacus Response Twitch response elicited;Palpable increased muscle length                  PT Short Term Goals - 04/03/20 1531  PT SHORT TERM GOAL #1   Title Patient will demonstrate improved pelvic alignment and balance of musculature surrounding the pelvis to facilitate decreased PFM spasms and decrease pelvic pain.    Baseline L anterior rotation    Time 5    Period Weeks    Status New    Target Date 05/08/20      PT SHORT TERM GOAL #2   Title Pt. will demonstrate appropriate implementation of urge suppression techniques and understandign of soda-can theory and bladder irritants to allow for decreased UUI.    Baseline Pt. having UUI occasionally when bladder very full.    Time 5    Period Weeks     Status New    Target Date 05/08/20             PT Long Term Goals - 04/03/20 1535      PT LONG TERM GOAL #1   Title Patient will report no episodes of UUI over the course of the prior two weeks to demonstrate improved functional ability.    Baseline Pt. having mild UUI when her bladder is very full    Time 10    Period Weeks    Status New    Target Date 06/12/20      PT LONG TERM GOAL #2   Title Patient will describe pain no greater than 3/10 during Walking for 30 min. to demonstrate improved functional ability and ability to increase activity for overall improved health.    Baseline Pt. has increased pain after walking ~ 5 min. High pain is 9/10    Time 10    Period Weeks    Status New    Target Date 06/12/20      PT LONG TERM GOAL #3   Title Pt. will demonstrate decrease FOTO score by 10 points in each category to demonstrated improved function.    Baseline PFDI  Pain:46,  Urinary  Problem:54,  PFDI  Urinary:17    Time 10    Period Weeks    Status New    Target Date 06/12/20                 Plan - 04/09/20 1646    Clinical Impression Statement Pt. Responded well to all interventions today, demonstrating improved pelvic alignment, decreased spasm and pain, as well as understanding and correct performance of all education and exercises provided today. They will continue to benefit from skilled physical therapy to work toward remaining goals and maximize function as well as decrease likelihood of symptom increase or recurrence.    PT Next Visit Plan TPDN to B QL, Teach self TP release for posterior hip and LB muscles.    PT Home Exercise Plan diaphragmatic breathing, side-stretch, hip-flexor stretch    Consulted and Agree with Plan of Care Patient           Patient will benefit from skilled therapeutic intervention in order to improve the following deficits and impairments:     Visit Diagnosis: Sacrococcygeal disorders, not elsewhere classified  Hypermobility  syndrome  Other muscle spasm     Problem List Patient Active Problem List   Diagnosis Date Noted   History of chlamydia 08/30/2018   Obesity 02/13/2015   Cleophus Molt DPT, ATC Cleophus Molt 04/09/2020, 4:48 PM  [place] [facility] MAIN [facility] SERVICES [address] [place], [place], [address] Phone: [phone]   Fax:  [phone]  Name: Isabella Weaver MRN: [id number] Date of Birth: February 12, 1991
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
## 2020-04-09 NOTE — Patient Instructions (Signed)
   Hold for 30 seconds (5 deep breaths) and repeat 2-3 times on each side once a day  Flexors, Lunge  Hip Flexor Stretch: Proposal Pose    Maintain pelvic tuck under, lift pubic bone toward navel. Engage posterior hip muscles (firm glute muscles of leg in back position) and shift forward until you feel stretch on front of leg that is down. To increase stretch, maintain balance and ease hips forward. You may use one hand on a chair for balance if needed. Hold for __5__ breaths. Repeat __2-3__ times each leg.  Do _1-2__ times per day.   

## 2020-04-13 ENCOUNTER — Ambulatory Visit: Payer: Medicaid Other

## 2020-04-13 ENCOUNTER — Other Ambulatory Visit: Payer: Self-pay

## 2020-04-13 DIAGNOSIS — M62838 Other muscle spasm: Secondary | ICD-10-CM

## 2020-04-13 DIAGNOSIS — M533 Sacrococcygeal disorders, not elsewhere classified: Secondary | ICD-10-CM | POA: Diagnosis not present

## 2020-04-13 DIAGNOSIS — M357 Hypermobility syndrome: Secondary | ICD-10-CM

## 2020-04-13 NOTE — Patient Instructions (Addendum)
As you start wearing your heel-lift only wear it for an hour the first day and increase by an hour each day so you can allow for the body to adapt to the change easily without much pain. If your pain increases by more than 1-2 points, back off slightly or slow down how quickly you increase your wear time. Once you reach a full day of wear, use it as much as possible forever, even in house-shoes or flip-flops if necessary to keep yourself from reverting to bad pelvic and spinal alignment and having symptoms return.    Adjust-a-lift heel lift Can be found at [employer]     Do 2 sets of 15 tilts per day. Breathe in when you tilt forward (A) and out when you tuck under (B).

## 2020-04-13 NOTE — Therapy (Signed)
[facility] MAIN [facility] SERVICES [address] [place], [place], [address] Phone: [phone]   Fax:  [phone]  Physical Therapy Treatment  The patient has been informed of current processes in place at Outpatient Rehab to protect patients from Covid-19 exposure including social distancing, schedule modifications, and new cleaning procedures. After discussing their particular risk with a therapist based on the patient's personal risk factors, the patient has decided to proceed with in-person therapy.  Patient Details  Name: Isabella Weaver MRN: [id number] Date of Birth: Aug 13, 1990 No data recorded  Encounter Date: 04/13/2020   PT End of Session - 04/13/20 1209    Visit Number 3    Number of Visits 10    Date for PT Re-Evaluation 06/12/20    Authorization Type Medicaid    Authorization Time Period 04/03/20 through    Authorization - Visit Number 3    Authorization - Number of Visits 4    Progress Note Due on Visit 4    PT Start Time 0930    PT Stop Time 1030    PT Time Calculation (min) 60 min    Activity Tolerance Patient tolerated treatment well;No increased pain    Behavior During Therapy WFL for tasks assessed/performed           Past Medical History:  Diagnosis Date  . Anxiety   . Depression   . Painful menstrual periods   . Vitamin D deficiency     No past surgical history on file.  There were no vitals filed for this visit.   Pelvic Floor Physical Therapy Treatment Note  SCREENING  Changes in medications, allergies, or medical history?: no    SUBJECTIVE  Patient reports: She is not in pain in her hips all the time now, her L knee hurts some.   Precautions:  Anxiety, Depression, Painful menstrual periods, believes that she may be autistic, was diagnosed with ADHD.  Pain update:  Location of pain: L Knee Current pain: 0/10  Max pain: 5/10 Least pain: 0/10 Nature of pain:dull ache with occasional  sharp/stab.  **0/10 pain following treatment.  Patient Goals: Not be afraid to move.   OBJECTIVE  Changes in: Posture/Observations:  PSIS level, L knee bent  **following treatment, PSIS level with heel-lift in R shoe   Range of Motion/Flexibilty:    Strength/MMT:  LE MMT:  Pelvic floor:  Abdominal:   Palpation: TTP to B QL   Gait Analysis:  INTERVENTIONS THIS SESSION: Manual: Performed TP release and STM to B QL to decrease spasm and pain and allow for improved balance of musculature for improved function and decreased symptoms.  Dry-needle: Performed TPDN with a .30x111mm needle and standard approach as described below to decrease spasm and pain and allow for improved balance of musculature for improved function and decreased symptoms.  Therex: Educated on and practiced seated pelvic tilts to improve strength of muscles opposing tight musculature to allow reciprocal inhibition to improve balance of musculature surrounding the pelvis and improve overall posture for optimal musculature length-tension relationship and function.  Theract: Educated on and given heel-lift for RLE to improve pelvic alignment and decrease risk for return of spasms and pain.  Total time: 60 min.                        Trigger Point Dry Needling - 04/13/20 0001    Consent Given? Yes    Education Handout Provided No    Muscles Treated Back/Hip  Quadratus lumborum    Dry Needling Comments Bilateral    Quadratus Lumborum Response Twitch response elicited;Palpable increased muscle length                  PT Short Term Goals - 04/03/20 1531      PT SHORT TERM GOAL #1   Title Patient will demonstrate improved pelvic alignment and balance of musculature surrounding the pelvis to facilitate decreased PFM spasms and decrease pelvic pain.    Baseline L anterior rotation    Time 5    Period Weeks    Status New    Target Date 05/08/20      PT SHORT TERM GOAL #2    Title Pt. will demonstrate appropriate implementation of urge suppression techniques and understandign of soda-can theory and bladder irritants to allow for decreased UUI.    Baseline Pt. having UUI occasionally when bladder very full.    Time 5    Period Weeks    Status New    Target Date 05/08/20             PT Long Term Goals - 04/03/20 1535      PT LONG TERM GOAL #1   Title Patient will report no episodes of UUI over the course of the prior two weeks to demonstrate improved functional ability.    Baseline Pt. having mild UUI when her bladder is very full    Time 10    Period Weeks    Status New    Target Date 06/12/20      PT LONG TERM GOAL #2   Title Patient will describe pain no greater than 3/10 during Walking for 30 min. to demonstrate improved functional ability and ability to increase activity for overall improved health.    Baseline Pt. has increased pain after walking ~ 5 min. High pain is 9/10    Time 10    Period Weeks    Status New    Target Date 06/12/20      PT LONG TERM GOAL #3   Title Pt. will demonstrate decrease FOTO score by 10 points in each category to demonstrated improved function.    Baseline PFDI  Pain:46,  Urinary  Problem:54,  PFDI  Urinary:17    Time 10    Period Weeks    Status New    Target Date 06/12/20                 Plan - 04/13/20 1209    Clinical Impression Statement Pt. Responded well to all interventions today, demonstrating improved pelvic alignment with heel-lift addition, decreased spasm and TTP, as well as understanding and correct performance of all education and exercises provided today. They will continue to benefit from skilled physical therapy to work toward remaining goals and maximize function as well as decrease likelihood of symptom increase or recurrence.    PT Next Visit Plan Assess/address posterior and lateral hip musculature, Teach self TP release for posterior hip and LB muscles as well as feet. give  deep-core, glute, feet, and scapular strengthening exercises.    PT Home Exercise Plan diaphragmatic breathing, side-stretch, hip-flexor stretch, seated pelvic tilts, heel-lift for RLE    Consulted and Agree with Plan of Care Patient           Patient will benefit from skilled therapeutic intervention in order to improve the following deficits and impairments:     Visit Diagnosis: Sacrococcygeal disorders, not elsewhere classified  Hypermobility syndrome  Other muscle  spasm     Problem List Patient Active Problem List   Diagnosis Date Noted  . History of chlamydia 08/30/2018  . Obesity 02/13/2015   Cleophus Molt DPT, ATC Cleophus Molt 04/13/2020, 12:14 PM  [place] [facility] MAIN [facility] SERVICES [address] [place], [place], [address] Phone: [phone]   Fax:  [phone]  Name: Kierstan Auer MRN: [id number] Date of Birth: 05-02-1990

## 2020-04-23 ENCOUNTER — Telehealth (INDEPENDENT_AMBULATORY_CARE_PROVIDER_SITE_OTHER): Payer: Medicaid Other | Admitting: Psychiatry

## 2020-04-23 ENCOUNTER — Encounter: Payer: Self-pay | Admitting: Psychiatry

## 2020-04-23 ENCOUNTER — Other Ambulatory Visit: Payer: Self-pay

## 2020-04-23 DIAGNOSIS — F4310 Post-traumatic stress disorder, unspecified: Secondary | ICD-10-CM | POA: Diagnosis not present

## 2020-04-23 DIAGNOSIS — F331 Major depressive disorder, recurrent, moderate: Secondary | ICD-10-CM | POA: Diagnosis not present

## 2020-04-23 DIAGNOSIS — R41840 Attention and concentration deficit: Secondary | ICD-10-CM | POA: Diagnosis not present

## 2020-04-23 MED ORDER — SERTRALINE HCL 100 MG PO TABS: 100.0000 mg | ORAL_TABLET | Freq: Every day | ORAL | 1 refills | Status: DC

## 2020-04-23 NOTE — Patient Instructions (Signed)
1. Increase sertraline 100 mg daily  2. Referral to therapy  3. Referral to evaluation of ADHD 4. Next appointment- 2/17 at 3:30 for 30 mins, video

## 2020-04-24 ENCOUNTER — Ambulatory Visit: Payer: Medicaid Other

## 2020-05-01 ENCOUNTER — Ambulatory Visit: Payer: Medicaid Other

## 2020-05-08 ENCOUNTER — Ambulatory Visit: Payer: Medicaid Other

## 2020-05-14 ENCOUNTER — Other Ambulatory Visit: Payer: Self-pay

## 2020-05-14 ENCOUNTER — Ambulatory Visit: Payer: Medicaid Other | Attending: Certified Nurse Midwife

## 2020-05-14 DIAGNOSIS — M62838 Other muscle spasm: Secondary | ICD-10-CM | POA: Diagnosis present

## 2020-05-14 DIAGNOSIS — M533 Sacrococcygeal disorders, not elsewhere classified: Secondary | ICD-10-CM | POA: Insufficient documentation

## 2020-05-14 DIAGNOSIS — M357 Hypermobility syndrome: Secondary | ICD-10-CM | POA: Diagnosis present

## 2020-05-14 NOTE — Therapy (Incomplete)
[facility] MAIN [facility] SERVICES [address] [place], [place], [address] Phone: [phone]   Fax:  [phone]  Physical Therapy Treatment  The patient has been informed of current processes in place at Outpatient Rehab to protect patients from Covid-19 exposure including social distancing, schedule modifications, and new cleaning procedures. After discussing their particular risk with a therapist based on the patient's personal risk factors, the patient has decided to proceed with in-person therapy.   Patient Details  Name: Carlinda Ohlson MRN: [id number] Date of Birth: 1990/06/17 No data recorded  Encounter Date: 05/14/2020    Past Medical History:  Diagnosis Date  . Anxiety   . Depression   . Painful menstrual periods   . Vitamin D deficiency     No past surgical history on file.  There were no vitals filed for this visit.   Pelvic Floor Physical Therapy Treatment Note  SCREENING  Changes in medications, allergies, or medical history?: no    SUBJECTIVE  Patient reports: She has not been wearing the heel-lift because it was irritating the underside of her foot. Has not been having too many issues. L knee still hurting at the end of a lot of walking still Precautions:  Anxiety, Depression, Painful menstrual periods, believes that she may be autistic, was diagnosed with ADHD.  Pain update:  Location of pain: L Knee Current pain: 0/10  Max pain: 5/10 Least pain: 0/10 Nature of pain:dull ache with occasional sharp/stab.  **0/10 pain following treatment.  Patient Goals: Not be afraid to move.   OBJECTIVE  Changes in: Posture/Observations:  PSIS level, L knee bent  **following treatment, PSIS level with heel-lift in R shoe   Range of Motion/Flexibilty:    Strength/MMT:  LE MMT:  Pelvic floor:  Abdominal:   Palpation: TTP to B QL   Gait Analysis:  INTERVENTIONS THIS SESSION: Manual: Performed TP  release and STM to B QL to decrease spasm and pain and allow for improved balance of musculature for improved function and decreased symptoms.  Dry-needle: Performed TPDN with a .30x141mm needle and standard approach as described below to decrease spasm and pain and allow for improved balance of musculature for improved function and decreased symptoms.  Therex: Educated on and practiced seated pelvic tilts to improve strength of muscles opposing tight musculature to allow reciprocal inhibition to improve balance of musculature surrounding the pelvis and improve overall posture for optimal musculature length-tension relationship and function.  Theract: Educated on and given heel-lift for RLE to improve pelvic alignment and decrease risk for return of spasms and pain.  Total time: 60 min.                                 PT Short Term Goals - 04/03/20 1531      PT SHORT TERM GOAL #1   Title Patient will demonstrate improved pelvic alignment and balance of musculature surrounding the pelvis to facilitate decreased PFM spasms and decrease pelvic pain.    Baseline L anterior rotation    Time 5    Period Weeks    Status New    Target Date 05/08/20      PT SHORT TERM GOAL #2   Title Pt. will demonstrate appropriate implementation of urge suppression techniques and understandign of soda-can theory and bladder irritants to allow for decreased UUI.    Baseline Pt. having UUI occasionally when bladder very full.  Time 5    Period Weeks    Status New    Target Date 05/08/20             PT Long Term Goals - 04/03/20 1535      PT LONG TERM GOAL #1   Title Patient will report no episodes of UUI over the course of the prior two weeks to demonstrate improved functional ability.    Baseline Pt. having mild UUI when her bladder is very full    Time 10    Period Weeks    Status New    Target Date 06/12/20      PT LONG TERM GOAL #2   Title Patient will describe  pain no greater than 3/10 during Walking for 30 min. to demonstrate improved functional ability and ability to increase activity for overall improved health.    Baseline Pt. has increased pain after walking ~ 5 min. High pain is 9/10    Time 10    Period Weeks    Status New    Target Date 06/12/20      PT LONG TERM GOAL #3   Title Pt. will demonstrate decrease FOTO score by 10 points in each category to demonstrated improved function.    Baseline PFDI  Pain:46,  Urinary  Problem:54,  PFDI  Urinary:17    Time 10    Period Weeks    Status New    Target Date 06/12/20                  Patient will benefit from skilled therapeutic intervention in order to improve the following deficits and impairments:     Visit Diagnosis: No diagnosis found.     Problem List Patient Active Problem List   Diagnosis Date Noted  . History of chlamydia 08/30/2018  . Obesity 02/13/2015    Cleophus Molt 05/14/2020, 4:36 PM  [place] [facility] MAIN [facility] SERVICES [address] [place], [place], [address] Phone: [phone]   Fax:  [phone]  Name: Amaani Guilbault MRN: [id number] Date of Birth: 01-20-1991

## 2020-05-14 NOTE — Patient Instructions (Signed)
    This is The QL muscle  To perform release on this muscle, start by getting into this position by bridging the hips up and then slowly lowering your back, then your butt down to lengthen the low back then put the ball under you where you feel the tender spot and roll to the same side slightly to add pressure as needed. Hold still and take deep breaths until the pain is at least 50% less or, ideally, just pressure.   This is your piriformis    To release this muscle start in this position with your ankle crossed over the opposite knee. Place the tennis ball under your buttock where the tender spot is and then slightly roll your weight to the same side to put just enough pressure that it is uncomfortable. Hold and take deep breaths until the pain is at least 50% less or, ideally ,just pressure.   This is your Gluteus Medius and Minimus  To perform release on this muscle, start by getting into this position by bridging the hips up and then slowly lowering your back, then your butt down to lengthen the low back then put the ball under you where you feel the tender spot and roll to the same side slightly to add pressure as needed. Hold still and take deep breaths until the pain is at least 50% less or, ideally, just pressure.   These are your deep hip-flexor muscles. They are easiest to reach where they come together at the hip.   To perform release on this muscle, start by getting into this position by laying on your stomach then put the ball under you where you feel the tender spot and bring the opposite knee up/out to the side to add pressure as needed. Hold still and take deep breaths until the pain is at least 50% less or, ideally ,just pressure.   Pelvic Tilt With Pelvic Floor (Hook-Lying)        Lie with hips and knees bent. Exhale and feel low tummy draw in first, then press low back to ground by squeezing glutes, last keep breathing out until you feel the upper abs draw together. Repeat  _2x15_ times. Do _1-2__ times a day.  Put a pillow under your hips in this position when doing this exercise to help decrease pressure in the pelvis when it feels "heavy".  Find thin shoe inset to put heel-lift under.

## 2020-05-15 ENCOUNTER — Ambulatory Visit: Payer: Medicaid Other

## 2020-05-15 NOTE — Therapy (Deleted)
[place] [facility] MAIN [facility] SERVICES [address] [place], [place], [address] Phone: [phone]   Fax:  [phone]  Physical Therapy Treatment  The patient has been informed of current processes in place at Outpatient Rehab to protect patients from Covid-19 exposure including social distancing, schedule modifications, and new cleaning procedures. After discussing their particular risk with a therapist based on the patient's personal risk factors, the patient has decided to proceed with in-person therapy.   Patient Details  Name: Isabella Weaver MRN: [id number] Date of Birth: [birth year] No data recorded  Encounter Date: 05/14/2020   PT End of Session - 05/18/20 0757    Visit Number 4    Number of Visits 10    Date for PT Re-Evaluation 06/12/20    Authorization Type Medicaid    Authorization Time Period 04/03/20 through    Authorization - Visit Number 4    Authorization - Number of Visits 4    Progress Note Due on Visit 4    PT Start Time 1630    PT Stop Time 1730    PT Time Calculation (min) 60 min    Activity Tolerance Patient tolerated treatment well;No increased pain    Behavior During Therapy WFL for tasks assessed/performed           Past Medical History:  Diagnosis Date  . Anxiety   . Depression   . Painful menstrual periods   . Vitamin D deficiency     No past surgical history on file.  There were no vitals filed for this visit.   Pelvic Floor Physical Therapy Treatment Note  SCREENING  Changes in medications, allergies, or medical history?: no    SUBJECTIVE  Patient reports: She has not been wearing the heel-lift because it was irritating the underside of her foot. Has not been having too many issues. L knee still hurting at the end of a lot of walking still but not as bad and she is walking a lot since she was able to get her old job back working for [employer].  Precautions:  Anxiety, Depression, Painful menstrual  periods, believes that she may be autistic, was diagnosed with ADHD.  Pain update:  Location of pain: L Knee Current pain: 0/10  Max pain: 5/10 Least pain: 0/10 Nature of pain:dull ache with occasional sharp/stab.  **0/10 pain following treatment.  Patient Goals: Not be afraid to move.   OBJECTIVE  Changes in: Posture/Observations:  PSIS level, L knee bent **following treatment, PSIS level with heel-lift in R shoe (from previous session)  TODAY: Patient is not wearing her heel-lift today due to citing discomfort on the bottom of er foot with prolonged walking. L PSIS high.    Range of Motion/Flexibilty:    Strength/MMT:  LE MMT:  Pelvic floor:  Abdominal:  Pt. Demonstrates difficulty recruiting the TA, Obliques and multifidus with posterior pelvic tilts requiring  MAX TC and VC to attain correct form.   **following TP release Pt. ~ 50% improved in performance without TC but will require review.  Palpation: TTP to B foot intrinsics, lumbar paraspinals, Piriformis and Glute Max.  Gait Analysis:  INTERVENTIONS THIS SESSION: Manual: Performed TP release and STM to B  lumbar paraspinals, Piriformis and Glute Max  to decrease spasm and pain and allow for improved recruitment of opposing musculature and balance of musculature for improved function and decreased symptoms.  Therex: Educated on and practiced supine pelvic tilts with MAX VC, TC to improve recruitment and strength of muscles  opposing tight musculature to allow reciprocal inhibition to improve balance of musculature surrounding the pelvis and improve overall posture for optimal musculature length-tension relationship and function. Educated on and practiced using a tennis ball to do self TP release to the foot intrinsics and muscles surrounding the pelvis to decrease spasm and pain and allow for improved balance of musculature for improved function and decreased symptoms.  Total time: 60  min.                                 PT Short Term Goals - 05/18/20 0800      PT SHORT TERM GOAL #1   Title Patient will demonstrate improved pelvic alignment and balance of musculature surrounding the pelvis to facilitate decreased PFM spasms and decrease pelvic pain.    Baseline L anterior rotation    Time 5    Period Weeks    Status Achieved    Target Date 05/08/20      PT SHORT TERM GOAL #2   Title Pt. will demonstrate appropriate implementation of urge suppression techniques and understandign of soda-can theory and bladder irritants to allow for decreased UUI.    Baseline Pt. having UUI occasionally when bladder very full.    Time 5    Period Weeks    Status Achieved    Target Date 05/08/20             PT Long Term Goals - 05/18/20 0800      PT LONG TERM GOAL #1   Title Patient will report no episodes of UUI over the course of the prior two weeks to demonstrate improved functional ability.    Baseline Pt. having mild UUI when her bladder is very full    Time 10    Period Weeks    Status Achieved    Target Date 05/23/20      PT LONG TERM GOAL #2   Title Patient will describe pain no greater than 3/10 during Walking for 30 min. to demonstrate improved functional ability and ability to increase activity for overall improved health.    Baseline Pt. has increased pain after walking ~ 5 min. High pain is 9/10 As of 1/31: high pain is 5/10 in L knee following walking at work    Time 10    Period Weeks    Status On-going    Target Date 06/12/20      PT LONG TERM GOAL #3   Title Pt. will demonstrate decrease FOTO score by 10 points in each category to demonstrated improved function.    Baseline PFDI  Pain:46,  Urinary  Problem:54,  PFDI  Urinary:17    Time 10    Period Weeks    Status On-going    Target Date 06/12/20                 Plan - 05/18/20 0758    PT Next Visit Plan Assess/address posterior and lateral hip musculature,   give deep-core, glute, feet, and scapular strengthening exercises.    PT Home Exercise Plan diaphragmatic breathing, side-stretch, hip-flexor stretch, seated pelvic tilts, heel-lift for RLE, self TP release to hips/back/feet, posterior pelvic tilts    Consulted and Agree with Plan of Care Patient           Patient will benefit from skilled therapeutic intervention in order to improve the following deficits and impairments:  Hypermobility,Difficulty walking,Increased muscle spasms,Decreased strength,Decreased activity tolerance,Pain,Obesity,Postural  dysfunction  Visit Diagnosis: Sacrococcygeal disorders, not elsewhere classified  Hypermobility syndrome  Other muscle spasm     Problem List Patient Active Problem List   Diagnosis Date Noted  . History of chlamydia 08/30/2018  . Obesity 02/13/2015   Cleophus Molt DPT, ATC Cleophus Molt 05/18/2020, 8:25 AM  [place] [facility] MAIN [facility] SERVICES [address] [place], [place], [address] Phone: [phone]   Fax:  [phone]  Name: Isabella Weaver MRN: [id number] Date of Birth: Oct 05, 1990

## 2020-05-18 NOTE — Therapy (Signed)
[place] [facility] MAIN [facility] SERVICES [address] [place], [place], [address] Phone: [phone]   Fax:  [phone]  Physical Therapy Treatment  The patient has been informed of current processes in place at Outpatient Rehab to protect patients from Covid-19 exposure including social distancing, schedule modifications, and new cleaning procedures. After discussing their particular risk with a therapist based on the patient's personal risk factors, the patient has decided to proceed with in-person therapy.   Patient Details  Name: Isabella Weaver MRN: [id number] Date of Birth: 03-19-1991 No data recorded  Encounter Date: 05/14/2020   PT End of Session - 05/18/20 0757    Visit Number 4    Number of Visits 10    Date for PT Re-Evaluation 06/12/20    Authorization Type Medicaid    Authorization Time Period 04/03/20 through    Authorization - Visit Number 4    Authorization - Number of Visits 4    Progress Note Due on Visit 4    PT Start Time 1630    PT Stop Time 1730    PT Time Calculation (min) 60 min    Activity Tolerance Patient tolerated treatment well;No increased pain    Behavior During Therapy WFL for tasks assessed/performed           Past Medical History:  Diagnosis Date  . Anxiety   . Depression   . Painful menstrual periods   . Vitamin D deficiency     No past surgical history on file.  There were no vitals filed for this visit.    Pelvic Floor Physical Therapy Treatment Note  SCREENING  Changes in medications, allergies, or medical history?: no    SUBJECTIVE  Patient reports: She has not been wearing the heel-lift because it was irritating the underside of her foot. Has not been having too many issues. L knee still hurting at the end of a lot of walking still but not as bad and she is walking a lot since she was able to get her old job back working for [employer].  Precautions:  Anxiety, Depression, Painful menstrual  periods, believes that she may be autistic, was diagnosed with ADHD.  Pain update:  Location of pain: L Knee Current pain: 0/10  Max pain: 5/10 Least pain: 0/10 Nature of pain:dull ache with occasional sharp/stab.  **0/10 pain following treatment.  Patient Goals: Not be afraid to move.   OBJECTIVE  Changes in: Posture/Observations:  PSIS level, L knee bent **following treatment, PSIS level with heel-lift in R shoe (from previous session)  TODAY: Patient is not wearing her heel-lift today due to citing discomfort on the bottom of er foot with prolonged walking. L PSIS high.    Range of Motion/Flexibilty:    Strength/MMT:  LE MMT:  Pelvic floor:  Abdominal:  Pt. Demonstrates difficulty recruiting the TA, Obliques and multifidus with posterior pelvic tilts requiring  MAX TC and VC to attain correct form.   **following TP release Pt. ~ 50% improved in performance without TC but will require review.  Palpation: TTP to B foot intrinsics, lumbar paraspinals, Piriformis and Glute Max.  Gait Analysis:  INTERVENTIONS THIS SESSION: Manual: Performed TP release and STM to B  lumbar paraspinals, Piriformis and Glute Max  to decrease spasm and pain and allow for improved recruitment of opposing musculature and balance of musculature for improved function and decreased symptoms.  Therex: Educated on and practiced supine pelvic tilts with MAX VC, TC to improve recruitment and strength of  muscles opposing tight musculature to allow reciprocal inhibition to improve balance of musculature surrounding the pelvis and improve overall posture for optimal musculature length-tension relationship and function. Educated on and practiced using a tennis ball to do self TP release to the foot intrinsics and muscles surrounding the pelvis to decrease spasm and pain and allow for improved balance of musculature for improved function and decreased symptoms.  Total time: 60  min.                                PT Short Term Goals - 05/18/20 0800      PT SHORT TERM GOAL #1   Title Patient will demonstrate improved pelvic alignment and balance of musculature surrounding the pelvis to facilitate decreased PFM spasms and decrease pelvic pain.    Baseline L anterior rotation    Time 5    Period Weeks    Status Achieved    Target Date 05/08/20      PT SHORT TERM GOAL #2   Title Pt. will demonstrate appropriate implementation of urge suppression techniques and understandign of soda-can theory and bladder irritants to allow for decreased UUI.    Baseline Pt. having UUI occasionally when bladder very full.    Time 5    Period Weeks    Status Achieved    Target Date 05/08/20             PT Long Term Goals - 05/18/20 0800      PT LONG TERM GOAL #1   Title Patient will report no episodes of UUI over the course of the prior two weeks to demonstrate improved functional ability.    Baseline Pt. having mild UUI when her bladder is very full    Time 10    Period Weeks    Status Achieved    Target Date 05/23/20      PT LONG TERM GOAL #2   Title Patient will describe pain no greater than 3/10 during Walking for 30 min. to demonstrate improved functional ability and ability to increase activity for overall improved health.    Baseline Pt. has increased pain after walking ~ 5 min. High pain is 9/10 As of 1/31: high pain is 5/10 in L knee following walking at work    Time 10    Period Weeks    Status On-going    Target Date 06/12/20      PT LONG TERM GOAL #3   Title Pt. will demonstrate decrease FOTO score by 10 points in each category to demonstrated improved function.    Baseline PFDI  Pain:46,  Urinary  Problem:54,  PFDI  Urinary:17    Time 10    Period Weeks    Status On-going    Target Date 06/12/20                 Plan - 05/18/20 0758    Clinical Impression Statement [name] has made significan progress thus far in  PT, demonstrating decreased pain and improved PFM function but she continues to have pain as high as 5/10 with walking at work and is not yet consistent with her heel-lift use to help prevent return of mal-alignment and pain. She will benefit from further PT to continue working toward original goals via further education on hypermobility and how to manage it through postural strengthening and improved body mechanics/posture.    Personal Factors and Comorbidities Comorbidity 2  Comorbidities Anxiety, Depression, Painful menstrual periods    Examination-Activity Limitations Continence;Locomotion Level;Squat;Stand;Stairs    Stability/Clinical Decision Making Evolving/Moderate complexity    Clinical Decision Making Moderate    Rehab Potential Good    PT Frequency 1x / week    PT Duration 6 weeks    PT Treatment/Interventions ADLs/Self Care Home Management;Biofeedback;Electrical Stimulation;Moist Heat;Ultrasound;Traction;Gait training;Functional mobility training;Therapeutic activities;Neuromuscular re-education;Therapeutic exercise;Stair training;Balance training;Patient/family education;Manual techniques;Scar mobilization;Dry needling;Passive range of motion;Taping;Spinal Manipulations;Joint Manipulations    PT Next Visit Plan Assess/address posterior and lateral hip musculature,  give deep-core, glute, feet, and scapular strengthening exercises.    PT Home Exercise Plan diaphragmatic breathing, side-stretch, hip-flexor stretch, seated pelvic tilts, heel-lift for RLE, self TP release to hips/back/feet, posterior pelvic tilts, self TP release, posterior pelvic tilts.    Consulted and Agree with Plan of Care Patient           Patient will benefit from skilled therapeutic intervention in order to improve the following deficits and impairments:  Hypermobility,Difficulty walking,Increased muscle spasms,Decreased strength,Decreased activity tolerance,Pain,Obesity,Postural dysfunction  Visit  Diagnosis: Sacrococcygeal disorders, not elsewhere classified  Hypermobility syndrome  Other muscle spasm     Problem List Patient Active Problem List   Diagnosis Date Noted  . History of chlamydia 08/30/2018  . Obesity 02/13/2015   Cleophus Molt DPT, ATC Cleophus Molt 05/18/2020, 8:25 AM  [place] [facility] MAIN [facility] SERVICES [address] [place], [place], [address] Phone: [phone]   Fax:  [phone]  Name: Wrenly Lauritsen MRN: [id number] Date of Birth: [birth year]

## 2020-05-18 NOTE — Addendum Note (Signed)
Addended by: Flora Lipps T on: 05/18/2020 08:29 AM   Modules accepted: Orders

## 2020-05-19 ENCOUNTER — Other Ambulatory Visit: Payer: Self-pay

## 2020-05-19 ENCOUNTER — Ambulatory Visit: Payer: Medicaid Other | Attending: Certified Nurse Midwife

## 2020-05-19 DIAGNOSIS — M533 Sacrococcygeal disorders, not elsewhere classified: Secondary | ICD-10-CM | POA: Insufficient documentation

## 2020-05-19 DIAGNOSIS — M62838 Other muscle spasm: Secondary | ICD-10-CM | POA: Insufficient documentation

## 2020-05-19 DIAGNOSIS — M357 Hypermobility syndrome: Secondary | ICD-10-CM | POA: Diagnosis present

## 2020-05-19 NOTE — Patient Instructions (Addendum)
Hip Abduction: Side Leg Lift - Side-Lying    Lie on side. Draw lower tummy muscle (TA) and pelvic floor in, Lift top leg until you feel strong contraction of muscle on the side of the hip. Keep top leg straight with body, toes pointing forward. Do not let your hip roll back! Do _10__ reps per set, __3_ sets per day  Hip Extension:    Lying face down, bend the knee and exhale as you press through the heel toraise leg just off floor by squeezing glutes. Hold 1 count. Lower leg to floor. Do _10__ reps per set, __3_ sets for each leg. Optional: Place small pillow under abdomen if back becomes uncomfortable.    Exhale and do a chin tuck (make a double chin) and pull shoulder blades back and together. Hold for 1 sec. Repeat 2x15  OR    Shoulder Retraction and Downward Rotation   Rotate the shoulder blades back and down as if you had to hold a pencil between them, holding for 1 full second each time. Repeat this _3x10_ times _1-2_ times per day.      Start with Shoulder Retraction and Downward Rotation pictures above, holding the position while pulling the chin straight back as if trying to make a "double chin".  Breathe in forward and breathe out as you pull back, repeating this _3x10__ times __1-2__ times per day.

## 2020-05-19 NOTE — Therapy (Signed)
[place] [facility] MAIN [facility] SERVICES [address] [place], [place], [address] Phone: [phone]   Fax:  [phone]  Physical Therapy Treatment  The patient has been informed of current processes in place at Outpatient Rehab to protect patients from Covid-19 exposure including social distancing, schedule modifications, and new cleaning procedures. After discussing their particular risk with a therapist based on the patient's personal risk factors, the patient has decided to proceed with in-person therapy.  Patient Details  Name: Isabella Weaver MRN: [id number] Date of Birth: 1990-08-20 No data recorded  Encounter Date: 05/19/2020   PT End of Session - 05/21/20 0813    Visit Number 5    Number of Visits 10    Date for PT Re-Evaluation 06/12/20    Authorization Type Medicaid    Authorization Time Period 04/03/20 through    Authorization - Visit Number 1    Authorization - Number of Visits 6    Progress Note Due on Visit 4    PT Start Time 1630    PT Stop Time 1730    PT Time Calculation (min) 60 min    Activity Tolerance Patient tolerated treatment well;No increased pain    Behavior During Therapy WFL for tasks assessed/performed           Past Medical History:  Diagnosis Date   Anxiety    Depression    Painful menstrual periods    Vitamin D deficiency     No past surgical history on file.  There were no vitals filed for this visit.     Pelvic Floor Physical Therapy Treatment Note  SCREENING  Changes in medications, allergies, or medical history?: no    SUBJECTIVE  Patient reports: She found some old inserts from an old pair of shoes and put the lift under it and that has worked. Has some pain in the upper back from work. 5/10 in the upper back today.  Precautions:  Anxiety, Depression, Painful menstrual periods, believes that she may be autistic, was diagnosed with ADHD.  Pain update:  Location of pain: L Knee Current  pain: 0/10  Max pain: 4/10 Least pain: 0/10 Nature of pain:dull ache with occasional sharp/stab.  **0/10 pain following treatment.  Patient Goals: Not be afraid to move.   OBJECTIVE  Changes in: Posture/Observations:  PSIS level, L knee bent **following treatment, PSIS level with heel-lift in R shoe (from previous session)  TODAY: PSIS not re-assessed but Pt. Is wearing heel-lift again. L deviation of C2-4 and hidden C5-6 vertebrae.  Range of Motion/Flexibilty:  Decreased PIVM through C7-T3  Strength/MMT:  LE MMT:  Pelvic floor:  Abdominal:  Pt. Demonstrates difficulty recruiting the TA, Obliques and multifidus with posterior pelvic tilts requiring  MAX TC and VC to attain correct form.   **following TP release Pt. ~ 50% improved in performance without TC but will require review. (from prior session)  Palpation: TTP to  B sub-occipitals and L>R cervical extensors  Gait Analysis:  INTERVENTIONS Performed TP release to B sub-occipitals and L>R cervical extensors as well as L to R rotational mobs to C 2-4 and PA mobs to C7-T3  to decrease spasm and pain and allow for improved recruitment of opposing musculature and balance of musculature for improved function and decreased symptoms and to improve mobility of joint and surrounding connective tissue and decrease pressure on nerve roots for improved conductivity and function of down-stream tissues.    Therex: Educated on and practiced side-lying and prone hip ABD/EXT  and prone and seated scapular retraction and chin-tucks to improve postureal stability and improve balance of musculature surrounding the C-T junction and improve overall posture for optimal musculature length-tension relationship and function.   Total time: 60 min.                              PT Short Term Goals - 05/18/20 0800      PT SHORT TERM GOAL #1   Title Patient will demonstrate improved pelvic alignment and balance of  musculature surrounding the pelvis to facilitate decreased PFM spasms and decrease pelvic pain.    Baseline L anterior rotation    Time 5    Period Weeks    Status Achieved    Target Date 05/08/20      PT SHORT TERM GOAL #2   Title Pt. will demonstrate appropriate implementation of urge suppression techniques and understandign of soda-can theory and bladder irritants to allow for decreased UUI.    Baseline Pt. having UUI occasionally when bladder very full.    Time 5    Period Weeks    Status Achieved    Target Date 05/08/20             PT Long Term Goals - 05/18/20 0800      PT LONG TERM GOAL #1   Title Patient will report no episodes of UUI over the course of the prior two weeks to demonstrate improved functional ability.    Baseline Pt. having mild UUI when her bladder is very full    Time 10    Period Weeks    Status Achieved    Target Date 05/23/20      PT LONG TERM GOAL #2   Title Patient will describe pain no greater than 3/10 during Walking for 30 min. to demonstrate improved functional ability and ability to increase activity for overall improved health.    Baseline Pt. has increased pain after walking ~ 5 min. High pain is 9/10 As of 1/31: high pain is 5/10 in L knee following walking at work    Time 10    Period Weeks    Status On-going    Target Date 06/12/20      PT LONG TERM GOAL #3   Title Pt. will demonstrate decrease FOTO score by 10 points in each category to demonstrated improved function.    Baseline PFDI  Pain:46,  Urinary  Problem:54,  PFDI  Urinary:17    Time 10    Period Weeks    Status On-going    Target Date 06/12/20                 Plan - 05/21/20 0814    Clinical Impression Statement Pt. Responded well to all interventions today, demonstrating improved cervical alignment, upper thoracic mobility, resolution of pain, as well as understanding and correct performance of all education and exercises provided today. They will continue to  benefit from skilled physical therapy to work toward remaining goals and maximize function as well as decrease likelihood of symptom increase or recurrence.     PT Next Visit Plan review and progress deep-core, educate on feet strengthening, and perform further scapular strengthening exercises with bands.    PT Home Exercise Plan diaphragmatic breathing, side-stretch, hip-flexor stretch, seated pelvic tilts, heel-lift for RLE, self TP release to hips/back/feet, posterior pelvic tilts, self TP release, posterior pelvic tilts, prone super-girls, lying hip ABD/EXT with pillow and bent  knee.    Consulted and Agree with Plan of Care Patient           Patient will benefit from skilled therapeutic intervention in order to improve the following deficits and impairments:     Visit Diagnosis: Sacrococcygeal disorders, not elsewhere classified  Hypermobility syndrome  Other muscle spasm     Problem List Patient Active Problem List   Diagnosis Date Noted   History of chlamydia 08/30/2018   Obesity 02/13/2015   Cleophus Molt DPT, ATC Cleophus Molt 05/21/2020, 8:22 AM  [place] [facility] MAIN [facility] SERVICES [address] [place], [place], [address] Phone: [phone]   Fax:  [phone]  Name: Isabella Weaver MRN: [id number] Date of Birth: [birth year]

## 2020-05-22 ENCOUNTER — Ambulatory Visit: Payer: Medicaid Other

## 2020-05-25 ENCOUNTER — Ambulatory Visit (INDEPENDENT_AMBULATORY_CARE_PROVIDER_SITE_OTHER): Payer: Medicaid Other | Admitting: Licensed Clinical Social Worker

## 2020-05-25 ENCOUNTER — Other Ambulatory Visit: Payer: Self-pay

## 2020-05-25 DIAGNOSIS — F4310 Post-traumatic stress disorder, unspecified: Secondary | ICD-10-CM | POA: Diagnosis not present
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
## 2020-05-26 ENCOUNTER — Ambulatory Visit: Payer: Medicaid Other

## 2020-05-29 ENCOUNTER — Ambulatory Visit: Payer: Medicaid Other
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
## 2020-06-02 ENCOUNTER — Ambulatory Visit: Payer: Medicaid Other

## 2020-06-02 ENCOUNTER — Other Ambulatory Visit: Payer: Self-pay

## 2020-06-02 DIAGNOSIS — M62838 Other muscle spasm: Secondary | ICD-10-CM

## 2020-06-02 DIAGNOSIS — M533 Sacrococcygeal disorders, not elsewhere classified: Secondary | ICD-10-CM

## 2020-06-02 DIAGNOSIS — M357 Hypermobility syndrome: Secondary | ICD-10-CM

## 2020-06-02 NOTE — Patient Instructions (Signed)
Toe "scrunches"  Do 2x10, 1 time per day  Arch "crunches"   Do 2x10, 1 time per day  Mini-Marches    Exhale, drawing the lower tummy (TA) in toward the back bone and hold contraction while you lift one foot ~ 2 inches off the mat, then the other foot before relaxing and resetting. Try to keep your hips from rocking, using your hands to sense whether they are staying even as pictured.      Perform _15__ repetitions for _2__ sets. Do this _1-2_ times per day.

## 2020-06-02 NOTE — Therapy (Signed)
[facility] MAIN [facility] SERVICES [address] [place], [place], [address] Phone: [phone]   Fax:  [phone]  Physical Therapy Treatment  The patient has been informed of current processes in place at Outpatient Rehab to protect patients from Covid-19 exposure including social distancing, schedule modifications, and new cleaning procedures. After discussing their particular risk with a therapist based on the patient's personal risk factors, the patient has decided to proceed with in-person therapy.   Patient Details  Name: Isabella Weaver MRN: [id number] Date of Birth: 29-May-1990 No data recorded  Encounter Date: 06/02/2020   PT End of Session - 06/04/20 0911    Visit Number 6    Number of Visits 10    Date for PT Re-Evaluation 06/12/20    Authorization Type Medicaid    Authorization Time Period 04/03/20 through    Authorization - Visit Number 2    Authorization - Number of Visits 6    Progress Note Due on Visit 10    PT Start Time 1630    PT Stop Time 1730    PT Time Calculation (min) 60 min    Activity Tolerance Patient tolerated treatment well;No increased pain    Behavior During Therapy WFL for tasks assessed/performed           Past Medical History:  Diagnosis Date   Anxiety    Depression    Painful menstrual periods    Vitamin D deficiency     No past surgical history on file.  There were no vitals filed for this visit.   Pelvic Floor Physical Therapy Treatment Note  SCREENING  Changes in medications, allergies, or medical history?: no    SUBJECTIVE  Patient reports: She has some pain in the upper back from looking down at a computer screen and her R foot hurts in the arch at the end of the day.  Precautions:  Anxiety, Depression, Painful menstrual periods, believes that she may be autistic, was diagnosed with ADHD.  Pain update:  Location of pain: mid back (R foot) Current pain: 4/10 (0/10)  Max  pain: 4/10 (7/10)when walking Least pain: 0/10 (0/10 Nature of pain:dull ache with occasional sharp/stab.  **0/10 pain in back or foot following treatment.  Patient Goals: Not be afraid to move.   OBJECTIVE  Changes in: Posture/Observations:  PSIS level, L knee bent **following treatment, PSIS level with heel-lift in R shoe (from previous session)  TODAY: PSIS appears level in standing  Range of Motion/Flexibilty:  Decreased PIVM through C7-T3 as well as decreased talar posterior glide and navicular mobility.  Strength/MMT:  LE MMT:  Pelvic floor:  Abdominal:  Pt. Demonstrates difficulty recruiting the TA, Obliques and multifidus with posterior pelvic tilts requiring  MAX TC and VC to attain correct form.  **following TP release Pt. ~ 50% improved in performance without TC but will require review. (from prior session)  TODAY: MOD cueing needed to help Pt. Recruit Obliques and multifidus but Pt. Able to consistently recruit TA. Pt. Able to progress to mini-marches   Palpation: TTP to  B multifidus and erector spinae at T4-7 levels as well as to R quadratus plantae and flexor digitorum brevis   Gait Analysis:  INTERVENTIONS   Manual: Performed TP release to  B multifidus and erector spinae at T4-7 levels as well as to R quadratus plantae and flexor digitorum brevis followed by grade 3-4 PA mobs to  C7-T3 as well as AP mobs to the talus and inferior to superior  glides of the navicular to decrease spasm and pain and allow for improved balance of musculature for improved function and decreased symptoms and to improve mobility of joint and surrounding connective tissue and decrease pressure on nerve roots for improved conductivity and function of down-stream tissues.    Therex: Reviewed and practiced posterior pelvic tilts and educated on and practiced mini-marches with emphasis on multifidus and obliques recruitment as well as toe scrunches and arch crunches to improve  postural strength and stability to reduce risk of spasm and pain returning.   Total time: 60 min.                                PT Short Term Goals - 05/18/20 0800      PT SHORT TERM GOAL #1   Title Patient will demonstrate improved pelvic alignment and balance of musculature surrounding the pelvis to facilitate decreased PFM spasms and decrease pelvic pain.    Baseline L anterior rotation    Time 5    Period Weeks    Status Achieved    Target Date 05/08/20      PT SHORT TERM GOAL #2   Title Pt. will demonstrate appropriate implementation of urge suppression techniques and understandign of soda-can theory and bladder irritants to allow for decreased UUI.    Baseline Pt. having UUI occasionally when bladder very full.    Time 5    Period Weeks    Status Achieved    Target Date 05/08/20             PT Long Term Goals - 05/18/20 0800      PT LONG TERM GOAL #1   Title Patient will report no episodes of UUI over the course of the prior two weeks to demonstrate improved functional ability.    Baseline Pt. having mild UUI when her bladder is very full    Time 10    Period Weeks    Status Achieved    Target Date 05/23/20      PT LONG TERM GOAL #2   Title Patient will describe pain no greater than 3/10 during Walking for 30 min. to demonstrate improved functional ability and ability to increase activity for overall improved health.    Baseline Pt. has increased pain after walking ~ 5 min. High pain is 9/10 As of 1/31: high pain is 5/10 in L knee following walking at work    Time 10    Period Weeks    Status On-going    Target Date 06/12/20      PT LONG TERM GOAL #3   Title Pt. will demonstrate decrease FOTO score by 10 points in each category to demonstrated improved function.    Baseline PFDI  Pain:46,  Urinary  Problem:54,  PFDI  Urinary:17    Time 10    Period Weeks    Status On-going    Target Date 06/12/20                 Plan -  06/04/20 0912    Clinical Impression Statement Pt. Responded well to all interventions today, demonstrating decreased spasm and pain, improved joint mobility, improved recruitment of the deep-core and foot musculature as well as understanding and correct performance of all education and exercises provided today. They will continue to benefit from skilled physical therapy to work toward remaining goals and maximize function as well as decrease likelihood of symptom increase  or recurrence.    PT Next Visit Plan review and progress deep-core with planks, boat-pose, and perform further scapular strengthening exercises with bands. Assess PFM?    PT Home Exercise Plan diaphragmatic breathing, side-stretch, hip-flexor stretch, seated pelvic tilts, heel-lift for RLE, self TP release to hips/back/feet, posterior pelvic tilts, self TP release, posterior pelvic tilts, prone super-girls, lying hip ABD/EXT with pillow and bent knee.    Consulted and Agree with Plan of Care Patient           Patient will benefit from skilled therapeutic intervention in order to improve the following deficits and impairments:     Visit Diagnosis: Sacrococcygeal disorders, not elsewhere classified  Hypermobility syndrome  Other muscle spasm     Problem List Patient Active Problem List   Diagnosis Date Noted   History of chlamydia 08/30/2018   Obesity 02/13/2015   Cleophus Molt DPT, ATC Cleophus Molt 06/04/2020, 9:16 AM  [facility] MAIN [facility] SERVICES [address] [place], [place], [address] Phone: [phone]   Fax:  [phone]  Name: Isabella Weaver MRN: [id number] Date of Birth: 1990/12/30

## 2020-06-04 ENCOUNTER — Other Ambulatory Visit: Payer: Self-pay

## 2020-06-04 ENCOUNTER — Encounter: Payer: Self-pay | Admitting: Psychiatry

## 2020-06-04 ENCOUNTER — Telehealth (INDEPENDENT_AMBULATORY_CARE_PROVIDER_SITE_OTHER): Payer: Medicaid Other | Admitting: Psychiatry

## 2020-06-04 DIAGNOSIS — F4310 Post-traumatic stress disorder, unspecified: Secondary | ICD-10-CM

## 2020-06-04 DIAGNOSIS — R41840 Attention and concentration deficit: Secondary | ICD-10-CM | POA: Diagnosis not present

## 2020-06-04 DIAGNOSIS — F3341 Major depressive disorder, recurrent, in partial remission: Secondary | ICD-10-CM | POA: Diagnosis not present

## 2020-06-04 MED ORDER — SERTRALINE HCL 100 MG PO TABS: 100.0000 mg | ORAL_TABLET | Freq: Every day | ORAL | 1 refills | Status: DC

## 2020-06-04 NOTE — Patient Instructions (Signed)
1. Continue sertraline 100 mg daily  2. Next appointment- 4/12 at 4:30

## 2020-06-05 ENCOUNTER — Ambulatory Visit: Payer: Medicaid Other

## 2020-06-09 ENCOUNTER — Ambulatory Visit: Payer: Medicaid Other

## 2020-06-12 ENCOUNTER — Ambulatory Visit: Payer: Medicaid Other

## 2020-06-15 ENCOUNTER — Ambulatory Visit: Payer: Medicaid Other

## 2020-06-23 ENCOUNTER — Ambulatory Visit (INDEPENDENT_AMBULATORY_CARE_PROVIDER_SITE_OTHER): Payer: Medicaid Other | Admitting: Licensed Clinical Social Worker

## 2020-06-23 ENCOUNTER — Other Ambulatory Visit: Payer: Self-pay

## 2020-06-23 DIAGNOSIS — F4310 Post-traumatic stress disorder, unspecified: Secondary | ICD-10-CM | POA: Diagnosis not present
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
## 2020-06-23 NOTE — Patient Instructions (Signed)
Disarming the Narcissist

## 2020-06-29 ENCOUNTER — Ambulatory Visit: Payer: Medicaid Other | Attending: Certified Nurse Midwife

## 2020-06-29 ENCOUNTER — Other Ambulatory Visit: Payer: Self-pay

## 2020-06-29 DIAGNOSIS — M357 Hypermobility syndrome: Secondary | ICD-10-CM | POA: Diagnosis present

## 2020-06-29 DIAGNOSIS — M533 Sacrococcygeal disorders, not elsewhere classified: Secondary | ICD-10-CM | POA: Diagnosis present

## 2020-06-29 DIAGNOSIS — M62838 Other muscle spasm: Secondary | ICD-10-CM | POA: Insufficient documentation

## 2020-06-29 NOTE — Patient Instructions (Signed)
   Boat Pose  1. Place fingers just inside each hip bone, in seated position like the photo below, lean back until you feel the low tummy muscle turn on and until just before you feel your back want to arch. 2. Try to hold for about ~30 seconds or until you feel fatigued/ body starts to compensate.  3. Use your hands to work your self back up. Feel stability under your feet.       Start on all fours, tuck your hips under slightly and come forward until you are in a straight line. Make sure that your knees, your hips, and your shoulders are in a straight line. Hold for ~ 30 seconds, (or until you feel like you are too tired to hold it correctly). Repeat 2-3 times (or up to ~ 1:30 sec. Total). Life-goal: be able to hold a plank for 3:30.  As you get stronger, you can lengthen each hold. When you can hold for ~ 1:30 straight, try switching to on your toes instead of knees. You will need to decrease your hold time initially and build back up to the full 1:30.  Walk "tall" like a string is attached to the crown of your head pulling straight up.   Do your glut exercises!

## 2020-06-29 NOTE — Therapy (Signed)
[place] MAIN [facility] SERVICES [address] [place], [place], [address] Phone: [phone]   Fax:  [phone]  Physical Therapy Treatment and Discharge Summary  The patient has been informed of current processes in place at Outpatient Rehab to protect patients from Covid-19 exposure including social distancing, schedule modifications, and new cleaning procedures. After discussing their particular risk with a therapist based on the patient's personal risk factors, the patient has decided to proceed with in-person therapy.  Patient Details  Name: Isabella Weaver MRN: [id number] Date of Birth: 10-31-1990 No data recorded  Encounter Date: 06/29/2020   PT End of Session - 06/30/20 0736    Visit Number 7    Number of Visits 10    Date for PT Re-Evaluation 06/12/20    Authorization Type Medicaid    Authorization Time Period 04/03/20 through    Authorization - Visit Number 3    Authorization - Number of Visits 6    Progress Note Due on Visit 10    PT Start Time 1640    PT Stop Time 1740    PT Time Calculation (min) 60 min    Activity Tolerance Patient tolerated treatment well;No increased pain    Behavior During Therapy WFL for tasks assessed/performed           Past Medical History:  Diagnosis Date  . Anxiety   . Depression   . Painful menstrual periods   . Vitamin D deficiency     No past surgical history on file.  There were no vitals filed for this visit.   Pelvic Floor Physical Therapy Treatment Note and Discharge Summary  SCREENING  Changes in medications, allergies, or medical history?: no    SUBJECTIVE  Patient reports: She has been doing well with her pain, has some pain in her feet after a long day but knows how to use her exercises and stretches to make it feel better and it does not carry over to the next day anymore. She is not having UUI. She has a strange shooting jaw pain sometimes that comes from below her ear  into her jaw and she wants to assess her walking because she thinks she is favoring her R foot.   Precautions:  Anxiety, Depression, Painful menstrual periods, believes that she may be autistic, was diagnosed with ADHD.  Pain update:  Location of pain: mid back (R foot) Current pain: 0/10 (0/10)  Max pain: 0/10 (5/10)when walking Least pain: 0/10 (0/10 Nature of pain:dull ache with occasional sharp/stab.   Patient Goals: Not be afraid to move.   OBJECTIVE  Changes in: Posture/Observations:  PSIS level, L knee bent **following treatment, PSIS level with heel-lift in R shoe (from previous session)  06/02/20: PSIS appears level in standing  TODAY: 0 80 0   Range of Motion/Flexibilty:  05/19/20: Decreased PIVM through C7-T3 as well as decreased talar posterior glide and navicular mobility.  Strength/MMT:  LE MMT:  Pelvic floor:  Abdominal:  Pt. Demonstrates difficulty recruiting the TA, Obliques and multifidus with posterior pelvic tilts requiring  MAX TC and VC to attain correct form.  **following TP release Pt. ~ 50% improved in performance without TC but will require review. (from prior session)  06/02/20: MOD cueing needed to help Pt. Recruit Obliques and multifidus but Pt. Able to consistently recruit TA. Pt. Able to progress to mini-marches   TODAY:  Palpation: TTP to  B multifidus and erector spinae at T4-7 levels as well as to R quadratus  plantae and flexor digitorum brevis   Gait Analysis:  INTERVENTIONS   Manual: Performed TP release to B SCM followed by grade 2-4 R to L mobs at the C1 transverse process to decrease spasm and pain and allow for improved balance of musculature for improved function and decreased symptoms and to improve mobility of joint and surrounding connective tissue and decrease pressure on nerve roots for improved conductivity and function of down-stream tissues.   Gait-training: Assessed walking mechanics and educated on "drawing up  tall" to engage postural muscles and the need to continue to support her arch and strengthen the foot and outer hip to prevent over-pronation.  Self-care: Reviewed goals and POC, decided to D/C after today's visit. Educated on how to maintain improvement through yoga and/or pilates and how to re-locate her elbow if it sub-luxates.   Therex: Reviewed and practiced mini-marches and educated on and practiced boat pose and planks in a chair with emphasis on TA  and obliques recruitment as well ashow to use a diamond under the tailbone to protect the low back and improve postural strength and stability to reduce risk of spasm and pain returning.   Total time: 60 min.                                 PT Short Term Goals - 05/18/20 0800      PT SHORT TERM GOAL #1   Title Patient will demonstrate improved pelvic alignment and balance of musculature surrounding the pelvis to facilitate decreased PFM spasms and decrease pelvic pain.    Baseline L anterior rotation    Time 5    Period Weeks    Status Achieved    Target Date 05/08/20      PT SHORT TERM GOAL #2   Title Pt. will demonstrate appropriate implementation of urge suppression techniques and understandign of soda-can theory and bladder irritants to allow for decreased UUI.    Baseline Pt. having UUI occasionally when bladder very full.    Time 5    Period Weeks    Status Achieved    Target Date 05/08/20             PT Long Term Goals - 06/29/20 1644      PT LONG TERM GOAL #1   Title Patient will report no episodes of UUI over the course of the prior two weeks to demonstrate improved functional ability.    Baseline Pt. having mild UUI when her bladder is very full    Time 10    Period Weeks    Status Achieved    Target Date 05/23/20      PT LONG TERM GOAL #2   Title Patient will describe pain no greater than 3/10 during Walking for 30 min. to demonstrate improved functional ability and ability to  increase activity for overall improved health.    Baseline Pt. has increased pain after walking ~ 5 min. High pain is 9/10 As of 1/31: high pain is 5/10 in L knee following walking at work    Time 10    Period Weeks    Status Achieved    Target Date 06/12/20      PT LONG TERM GOAL #3   Title Pt. will demonstrate decrease FOTO score by 10 points in each category to demonstrated improved function.    Baseline PFDI  Pain:46,  Urinary  Problem:54,  PFDI  Urinary:17 As of  06/29/20: Pain:0,  Urinary  Problem:80,  PFDI  Urinary: 0.    Time 10    Period Weeks    Status Achieved    Target Date 06/12/20                 Plan - 06/30/20 9144    Clinical Impression Statement Pt. has met or exceeded all of the goals and she responded well to all interventions today including decreased spasm and TTP as well as improved cervical mobility, posture and understanding of all education and exercises provided today. Pt. agrees that we will D/C at this time to HEP.    PT Next Visit Plan D/C    PT Home Exercise Plan diaphragmatic breathing, side-stretch, hip-flexor stretch, seated pelvic tilts, heel-lift for RLE, self TP release to hips/back/feet, posterior pelvic tilts, self TP release, posterior pelvic tilts, prone super-girls, lying hip ABD/EXT with pillow and bent knee, boat-pose, planks.    Consulted and Agree with Plan of Care Patient           Patient will benefit from skilled therapeutic intervention in order to improve the following deficits and impairments:     Visit Diagnosis: Sacrococcygeal disorders, not elsewhere classified  Hypermobility syndrome  Other muscle spasm     Problem List Patient Active Problem List   Diagnosis Date Noted  . History of chlamydia 08/30/2018  . Obesity 02/13/2015   Willa Rough DPT, ATC Willa Rough 06/30/2020, 8:13 AM  [place] MAIN [facility] SERVICES [address] [place], [place], [address] Phone:  [phone]   Fax:  [phone]  Name: Isabella Weaver MRN: [id number] Date of Birth: December 08, 1990

## 2020-07-06 ENCOUNTER — Ambulatory Visit: Payer: Medicaid Other

## 2020-07-11 IMAGING — US US OB < 14 WEEKS - US OB TV
1 series · 13 of 28 positions shown · non-contrast
Comparison: None for this gestation

CLINICAL DATA: Uncertain age, assess dates and viability

EXAM:
OBSTETRIC <14 WK US AND TRANSVAGINAL OB US
TECHNIQUE: Both transabdominal and transvaginal ultrasound examinations were
performed for complete evaluation of the gestation as well as the
maternal uterus, adnexal regions, and pelvic cul-de-sac.
Transvaginal technique was performed to assess early pregnancy.

[Series 1: us ob < 14 weeks - us ob tv · 0.17mm/px · 13 of 111 slices shown]
[im 5/111]
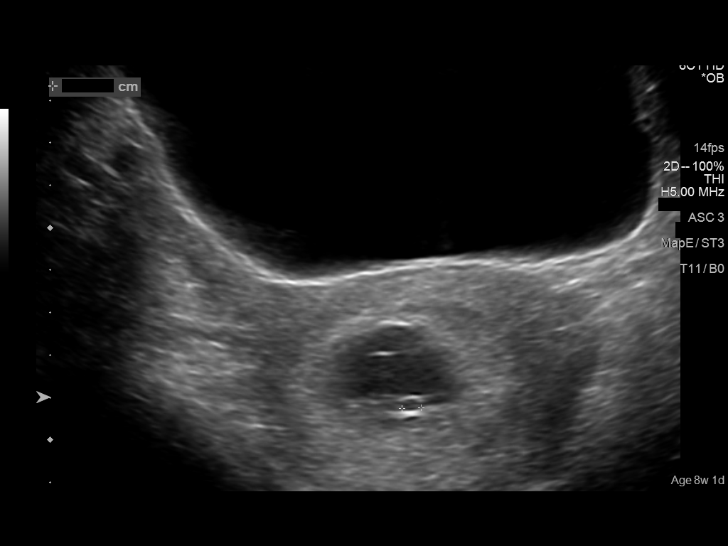
[im 13/111]
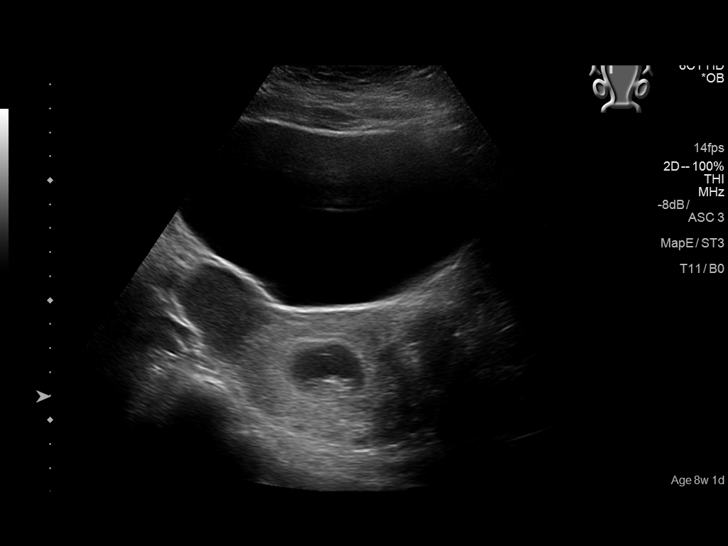
[im 21/111]
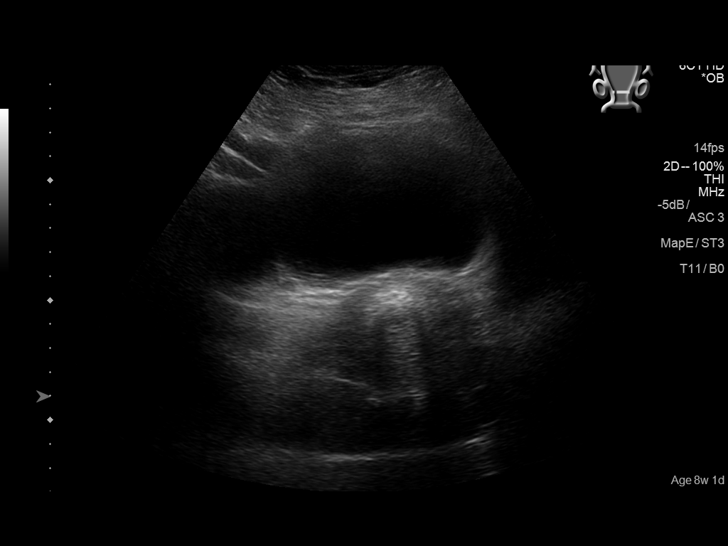
[im 29/111]
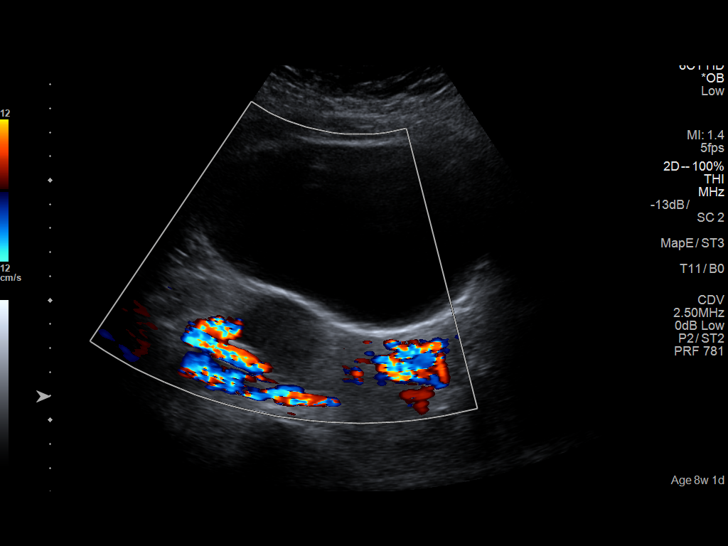
[im 37/111]
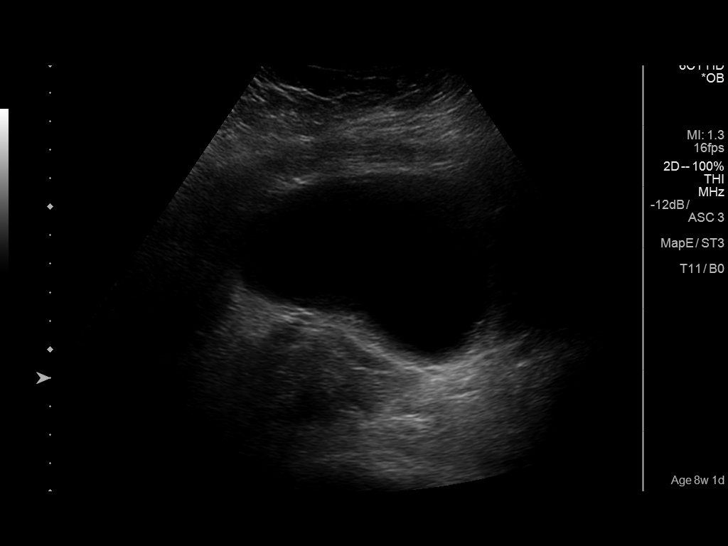
[im 45/111]
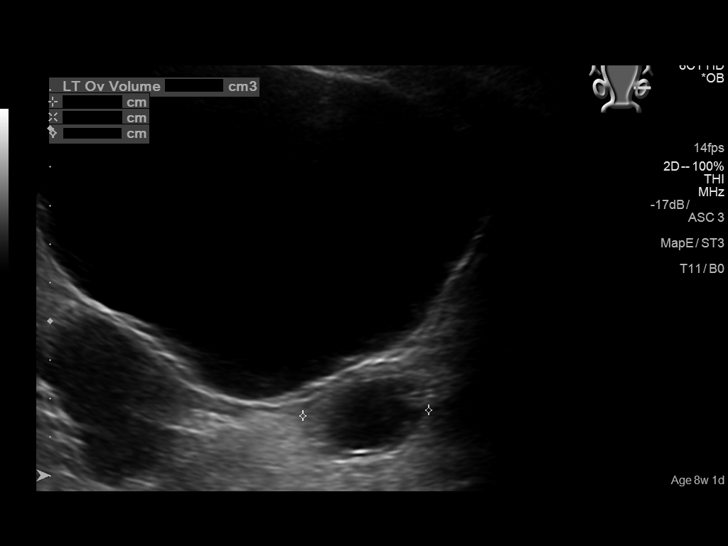
[im 58/111]
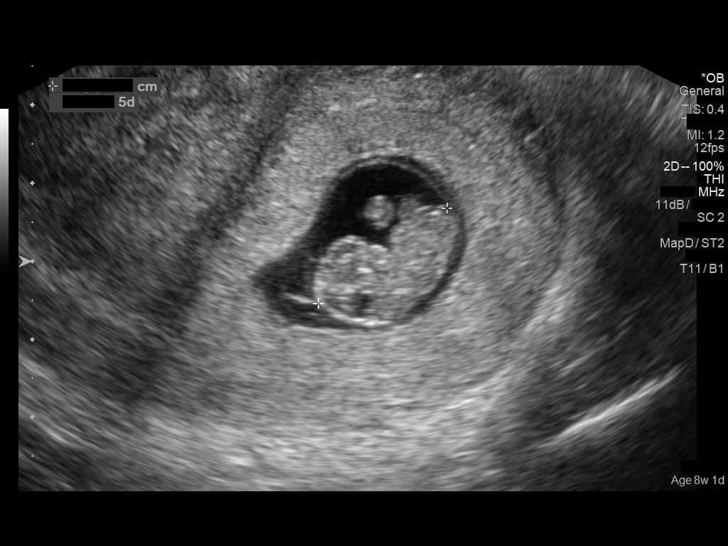
[im 66/111]
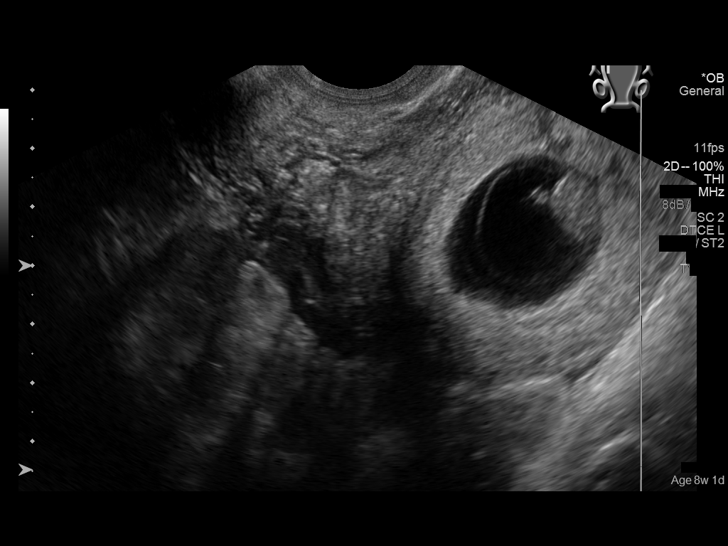
[im 74/111]
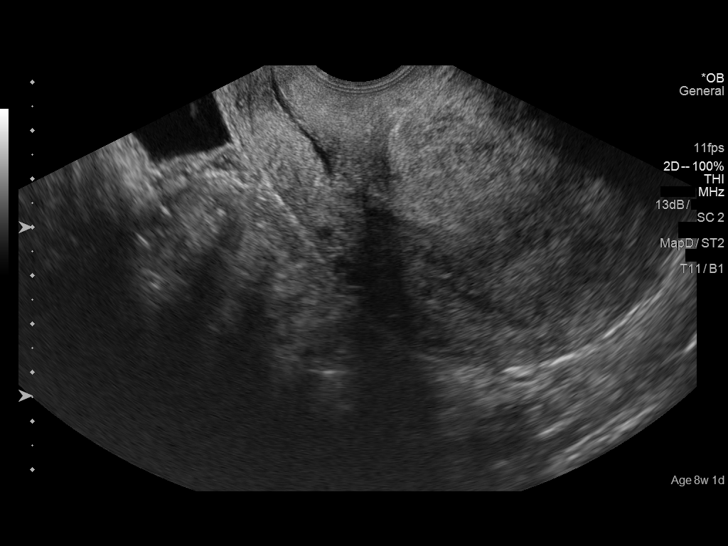
[im 82/111]
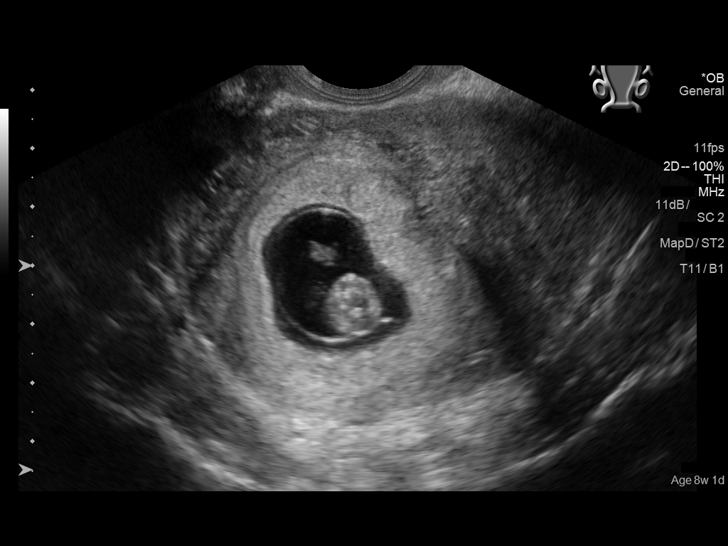
[im 90/111]
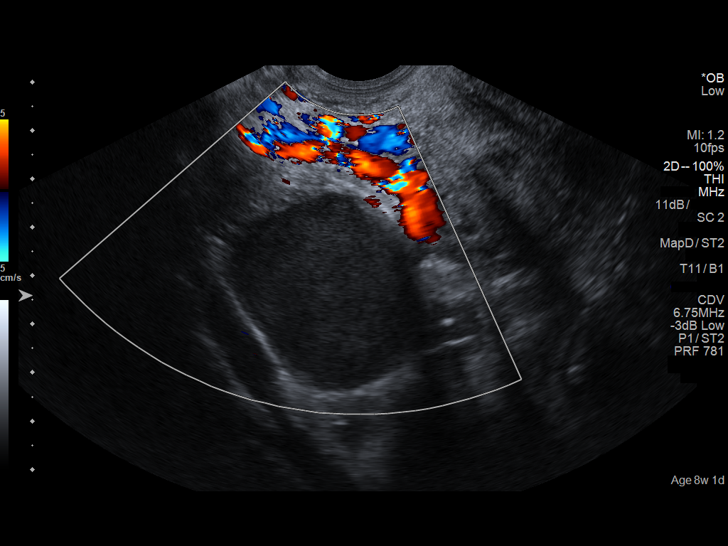
[im 98/111]
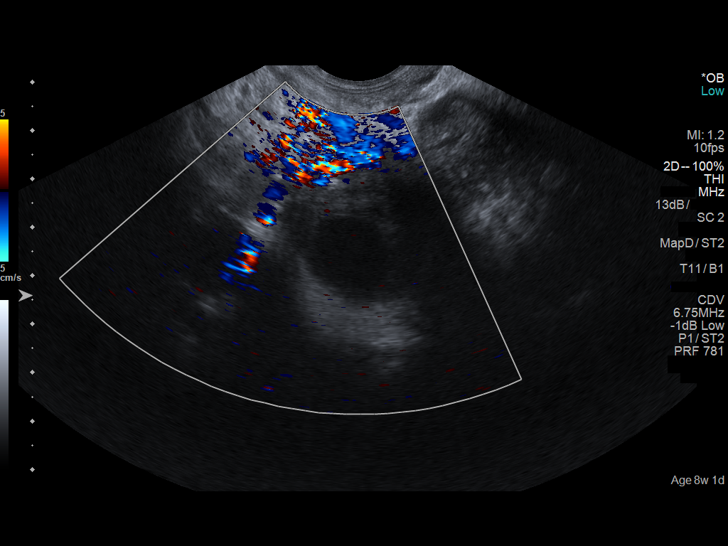
[im 106/111]
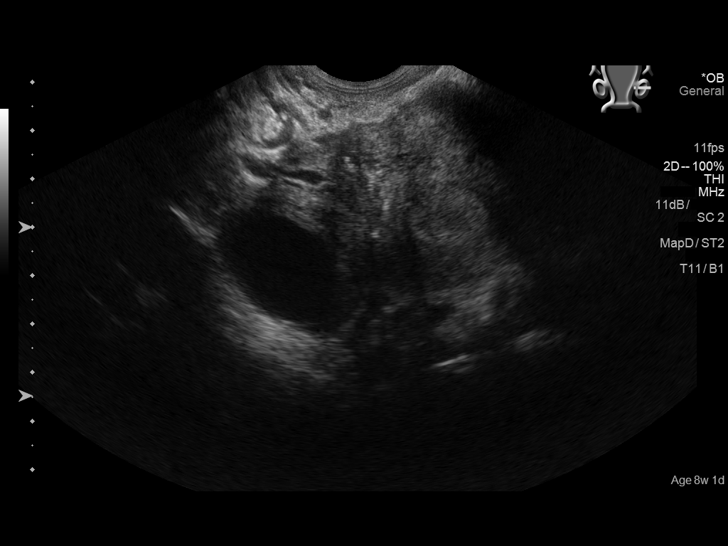

[13 of 28 positions shown; findings below may reference images not displayed]

FINDINGS: Intrauterine gestational sac: Present, single

Yolk sac:  Present

Embryo:  Present

Cardiac Activity: Present

Heart Rate: 169 bpm

CRL: 21.1 mm   8 w   5 d                  US EDC: 01/25/2019

Subchorionic hemorrhage:  None identified

Maternal uterus/adnexae:

LEFT ovary measures 3.8 x 2.3 x 3.3 cm and contains a probable
corpus luteal cyst.

RIGHT ovary measures 4.2 x 4.6 x 3.5 cm and contains a complicated
cyst 4.4 x 4.2 x 3.8 cm in size containing low level diffuse
internal echogenicity.

No free pelvic fluid or additional adnexal masses.

Visualized bladder unremarkable.
IMPRESSION: Single live intrauterine gestation at 8 weeks 5 days EGA by
crown-rump length.

Complicated RIGHT ovarian cyst 4.4 cm in greatest size, potentially
a hemorrhagic cyst; recommend attention on follow-up imaging.

## 2020-07-13 ENCOUNTER — Ambulatory Visit: Payer: Medicaid Other

## 2020-07-17 ENCOUNTER — Encounter: Payer: Self-pay | Admitting: Psychology
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
## 2020-07-23 ENCOUNTER — Ambulatory Visit (INDEPENDENT_AMBULATORY_CARE_PROVIDER_SITE_OTHER): Payer: Medicaid Other | Admitting: Licensed Clinical Social Worker

## 2020-07-23 ENCOUNTER — Other Ambulatory Visit: Payer: Self-pay

## 2020-07-23 DIAGNOSIS — F4310 Post-traumatic stress disorder, unspecified: Secondary | ICD-10-CM

## 2020-07-23 NOTE — Progress Notes (Signed)
Virtual Visit via Video Note  I connected with Isabella Weaver on 07/23/20 at  4:00 PM EDT by a video enabled telemedicine application and verified that I am speaking with the correct person using two identifiers.  Location: Patient: home Provider: remote office [place], [place])   I discussed the limitations of evaluation and management by telemedicine and the availability of in person appointments. The patient expressed understanding and agreed to proceed.  I discussed the assessment and treatment plan with the patient. The patient was provided an opportunity to ask questions and all were answered. The patient agreed with the plan and demonstrated an understanding of the instructions.   The patient was advised to call back or seek an in-person evaluation if the symptoms worsen or if the condition fails to improve as anticipated.  I provided 45 minutes of non-face-to-face time during this encounter.   Sheritta Deeg R Katie Moch, LCSW    THERAPIST PROGRESS NOTE  Session Time: 4-4:45p  Participation Level: Active  Behavioral Response: Neat and Well GroomedAlertAnxious and Depressed  Type of Therapy: Individual Therapy  Treatment Goals addressed: Anxiety and Coping  Interventions: CBT, Supportive and Reframing  Summary: Isabella Weaver is a 30 y.o. female who presents with symptoms consistent with PTSD.  Pt reports that overall mood has been fairly stable. Pt reports good quality and quantity of sleep.  Explored pros/cons of current relationship and gave pt safe space to discuss goals and steps for future (self and child).  Continued recommendations are as follows: self care behaviors, positive social engagements, focusing on overall work/home/life balance, and focusing on positive physical and emotional wellness.   Suicidal/Homicidal: No  Therapist Response: Isabella Weaver is working hard to identify coping skills that have been successful in the past and increase their use. Isabella Weaver is  understanding how thoughts, feelings, and behavioral actions contribute to anxiety and mood-related changes and how to manage that. Isabella Weaver is making positive statements regarding self and ability to cope with stresses of life. These behaviors are reflective of personal growth and progress. Treatment to continue.    Plan: Return again in 3 weeks.  Diagnosis: Axis I: Post Traumatic Stress Disorder    Axis II: No diagnosis    Ernest Haber Abimelec Grochowski, LCSW 07/23/2020

## 2020-07-28 ENCOUNTER — Telehealth: Payer: Medicaid Other | Admitting: Psychiatry

## 2020-07-28 ENCOUNTER — Other Ambulatory Visit: Payer: Self-pay
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
## 2020-07-30 ENCOUNTER — Telehealth (INDEPENDENT_AMBULATORY_CARE_PROVIDER_SITE_OTHER): Payer: Medicaid Other | Admitting: Psychiatry

## 2020-07-30 ENCOUNTER — Other Ambulatory Visit: Payer: Self-pay

## 2020-07-30 ENCOUNTER — Encounter: Payer: Self-pay | Admitting: Psychiatry

## 2020-07-30 DIAGNOSIS — F4310 Post-traumatic stress disorder, unspecified: Secondary | ICD-10-CM | POA: Diagnosis not present

## 2020-07-30 DIAGNOSIS — F3342 Major depressive disorder, recurrent, in full remission: Secondary | ICD-10-CM

## 2020-07-30 MED ORDER — SERTRALINE HCL 100 MG PO TABS: 100.0000 mg | ORAL_TABLET | Freq: Every day | ORAL | 0 refills | Status: DC

## 2020-07-30 NOTE — Patient Instructions (Signed)
1. Continue sertraline 100 mg daily  2. Next appointment-  7/14 at 1 PM

## 2020-08-31 ENCOUNTER — Ambulatory Visit: Payer: Medicaid Other | Admitting: Licensed Clinical Social Worker

## 2020-09-21 ENCOUNTER — Other Ambulatory Visit: Payer: Self-pay

## 2020-09-21 ENCOUNTER — Ambulatory Visit (INDEPENDENT_AMBULATORY_CARE_PROVIDER_SITE_OTHER): Payer: Medicaid Other | Admitting: Licensed Clinical Social Worker

## 2020-09-21 DIAGNOSIS — F4310 Post-traumatic stress disorder, unspecified: Secondary | ICD-10-CM | POA: Diagnosis not present
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
## 2020-10-22 ENCOUNTER — Ambulatory Visit (INDEPENDENT_AMBULATORY_CARE_PROVIDER_SITE_OTHER): Payer: Medicaid Other | Admitting: Licensed Clinical Social Worker

## 2020-10-22 ENCOUNTER — Other Ambulatory Visit: Payer: Self-pay

## 2020-10-22 DIAGNOSIS — Z5329 Procedure and treatment not carried out because of patient's decision for other reasons: Secondary | ICD-10-CM

## 2020-10-22 NOTE — Progress Notes (Signed)
LCSW counselor tried to connect with patient for scheduled appointment via MyChart video text request x 2 and email request; also tried to connect via phone without success. LCSW counselor attempted to leave voice mail unsuccessfully (voice mailbox was full)
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
## 2020-10-29 ENCOUNTER — Other Ambulatory Visit: Payer: Self-pay

## 2020-10-29 ENCOUNTER — Telehealth: Payer: Medicaid Other | Admitting: Psychiatry

## 2020-10-29 ENCOUNTER — Telehealth: Payer: Self-pay | Admitting: Psychiatry

## 2020-10-29 NOTE — Telephone Encounter (Signed)
Sent link for video visit through Epic. Patient did not sign in. Called the patient for appointment scheduled today. The patient did not answer the phone. Left voice message to contact the office ([phone]).   ?

## 2020-12-02 ENCOUNTER — Encounter: Payer: Self-pay | Admitting: Psychology

## 2020-12-02 ENCOUNTER — Other Ambulatory Visit: Payer: Self-pay

## 2020-12-02 ENCOUNTER — Encounter: Payer: Medicaid Other | Attending: Psychology | Admitting: Psychology

## 2020-12-02 DIAGNOSIS — F418 Other specified anxiety disorders: Secondary | ICD-10-CM | POA: Diagnosis not present

## 2020-12-02 DIAGNOSIS — R41840 Attention and concentration deficit: Secondary | ICD-10-CM | POA: Diagnosis present

## 2020-12-02 DIAGNOSIS — F4312 Post-traumatic stress disorder, chronic: Secondary | ICD-10-CM | POA: Diagnosis not present
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
## 2020-12-08 ENCOUNTER — Other Ambulatory Visit: Payer: Self-pay

## 2020-12-08 DIAGNOSIS — R41840 Attention and concentration deficit: Secondary | ICD-10-CM | POA: Diagnosis not present

## 2020-12-08 NOTE — Progress Notes (Signed)
   Behavioral Observation  The patient appeared well-groomed and appropriately dressed for the testing session. Her manners were polite and appropriate to the situation. She demonstrated a positive attitude toward testing and showed good effort.   Neuropsychology Note  Isabella Weaver completed 90 minutes of neuropsychological testing with technician, Marica Otter, BA, under the supervision of Arley Phenix, PsyD., Clinical Neuropsychologist. The patient did not appear overtly distressed by the testing session, per behavioral observation or via self-report to the technician. Rest breaks were offered.   Clinical Decision Making: In considering the patient's current level of functioning, level of presumed impairment, nature of symptoms, emotional and behavioral responses during clinical interview, level of literacy, and observed level of motivation/effort, a battery of tests was selected by Dr. Kieth Brightly during initial consultation on 12/02/2020. This was communicated to the technician. Communication between the neuropsychologist and technician was ongoing throughout the testing session and changes were made as deemed necessary based on patient performance on testing, technician observations and additional pertinent factors such as those listed above.  Tests Administered: Comprehensive Attention Battery (CAB) Continuous Performance Test (CPT)  Results: Will be included in final report   Feedback to Patient: Isabella Weaver will return on 02/03/2021 for an interactive feedback session with Dr. Kieth Brightly at which time her test performances, clinical impressions and treatment recommendations will be reviewed in detail. The patient understands she can contact our office should she require our assistance before this time.  90 minutes spent face-to-face with patient administering standardized tests, 30 minutes spent scoring Radiographer, therapeutic). [CPT [id number], [id number]]  Full report to follow.
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
## 2020-12-15 ENCOUNTER — Other Ambulatory Visit: Payer: Self-pay

## 2020-12-15 ENCOUNTER — Encounter: Payer: Self-pay | Admitting: Psychiatry

## 2020-12-15 ENCOUNTER — Telehealth (INDEPENDENT_AMBULATORY_CARE_PROVIDER_SITE_OTHER): Payer: Medicaid Other | Admitting: Psychiatry

## 2020-12-15 DIAGNOSIS — F4310 Post-traumatic stress disorder, unspecified: Secondary | ICD-10-CM | POA: Diagnosis not present

## 2020-12-15 DIAGNOSIS — F331 Major depressive disorder, recurrent, moderate: Secondary | ICD-10-CM | POA: Diagnosis not present

## 2020-12-15 MED ORDER — SERTRALINE HCL 100 MG PO TABS: 150.0000 mg | ORAL_TABLET | Freq: Every day | ORAL | 0 refills | Status: DC

## 2020-12-15 NOTE — Patient Instructions (Signed)
1. Increase sertraline 150 mg daily  2. Next appointment-  10/24 at 4:30

## 2021-01-13 ENCOUNTER — Other Ambulatory Visit: Payer: Self-pay

## 2021-01-13 ENCOUNTER — Encounter: Payer: Self-pay | Admitting: Psychology

## 2021-01-13 ENCOUNTER — Encounter: Payer: Medicaid Other | Attending: Psychology | Admitting: Psychology

## 2021-01-13 DIAGNOSIS — F418 Other specified anxiety disorders: Secondary | ICD-10-CM | POA: Diagnosis present

## 2021-01-13 DIAGNOSIS — F4312 Post-traumatic stress disorder, chronic: Secondary | ICD-10-CM | POA: Diagnosis present

## 2021-01-13 DIAGNOSIS — R41840 Attention and concentration deficit: Secondary | ICD-10-CM
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
## 2021-02-01 ENCOUNTER — Ambulatory Visit: Payer: Medicaid Other | Admitting: Psychology

## 2021-02-03 ENCOUNTER — Other Ambulatory Visit: Payer: Self-pay

## 2021-02-03 ENCOUNTER — Encounter: Payer: Medicaid Other | Attending: Psychology | Admitting: Psychology

## 2021-02-03 DIAGNOSIS — R41840 Attention and concentration deficit: Secondary | ICD-10-CM | POA: Insufficient documentation

## 2021-02-03 DIAGNOSIS — F418 Other specified anxiety disorders: Secondary | ICD-10-CM

## 2021-02-03 DIAGNOSIS — F4312 Post-traumatic stress disorder, chronic: Secondary | ICD-10-CM | POA: Diagnosis not present
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
## 2021-02-08 ENCOUNTER — Telehealth (INDEPENDENT_AMBULATORY_CARE_PROVIDER_SITE_OTHER): Payer: Medicaid Other | Admitting: Psychiatry

## 2021-02-08 ENCOUNTER — Encounter: Payer: Self-pay | Admitting: Psychiatry

## 2021-02-08 ENCOUNTER — Other Ambulatory Visit: Payer: Self-pay

## 2021-02-08 DIAGNOSIS — F3341 Major depressive disorder, recurrent, in partial remission: Secondary | ICD-10-CM | POA: Diagnosis not present

## 2021-02-08 DIAGNOSIS — F4310 Post-traumatic stress disorder, unspecified: Secondary | ICD-10-CM | POA: Diagnosis not present

## 2021-02-08 NOTE — Patient Instructions (Signed)
1. Continue sertraline 150 mg daily  2. Next appointment-  11/24 at 8:40

## 2021-02-10 ENCOUNTER — Encounter: Payer: Self-pay | Admitting: Psychology

## 2021-02-10 NOTE — Progress Notes (Signed)
02/03/2021 8 AM-9 AM:  Today's visit was an in person visit as conducted in outpatient clinic office.  Today I provided feedback regarding the results of the formal neuropsychological evaluation.  Below I will include a copy of the summary portion of the formal evaluation.  It can be found in its entirety in the patient's EMR dated 01/13/2021.  The patient continues to have significant residual effects of chronic posttraumatic stress disorder and depression with anxiety which are the primary contributing factors to her attention concentration deficits.  The patient does not show patterns consistent with adult residual attention deficit disorder.    Impression/Diagnosis:                     Overall, the results of the objective assessment of a wide range of attention and concentration and executive functioning measures showed no consistent pattern of attentional deficits other than slowed information processing speed.  The patient showed no evidence of impulsivity, significant distractibility, difficulties with targeted external distractions, difficulties with shifting attention, difficulties with encoding either auditory or visual information, or deficits with regard to sustaining attention.  The patient did show very consistent pattern on all measures assessed related to focus execute speed.  The patient consistently had response times that were between 1 and 2 standard deviation outside of normative expectation and these patterns were consistent for both auditory challenges as well as visual challenges.  As far as diagnostic considerations, this pattern is not consistent with those typically seen with adult residual attention deficit disorder.  While the patient had attentional issues and potentially some underlying learning disabilities related to writing and reading comprehension as a child there were also significant psychosocial stressors during her childhood.  The patient has had issues with depression  and anxiety as an adult and they do appear to be effectively treated with psychotropic interventions to this point.  The patient also has features consistent with chronic posttraumatic stress disorder likely due to significant childhood experiences.  I do suspect that the most likely explanation of her attentional issues in school were related to significant stress and difficulties with poor parenting and significant stress at home.  Currently, the patient continues to have issues with depression and anxiety although the are much more effectively manage now.  The patient also describes significant ongoing issues related to significant difficulties with sleep and excessive tiredness during the day.  The patient has difficulty falling asleep and intrusive thoughts and racing thoughts when she tries to go to sleep.  When the patient was a teenager she was tried on psychostimulant medications and while they triggered her to become much more organized and she actually improved her academic performance it was also associated with increasing patterns of anxiety and in particular obsessive-compulsive responses.  I would be concerned with any psychostimulant trials as they may exacerbate her anxiety and enhance patterns of OCD and may also have a further deleterious effect on her sleep patterns.  While no significant acute adverse responses would occur with psychostimulant use is likely and she may experience an initial improvement with regard to her daytime alertness over a longer period of time and is likely to cause more harm than good.  As far as treatment recommendations I do think that the primary focus of care beyond treatment of her anxiety and depressive symptomatology would be focused efforts to improve sleep hygiene and sleep quality as this is been a long-term issue for her.  Not only would improve sleep patterns help  with her anxiety and depression it also likely have a significant beneficial effect on her  attentional capacity.  Anxiety and depression and sustained sleep disturbance could easily explain her subjective reports of attentional difficulties and the one aspect showing up on her objective assessment of slowed information processing speed would also be easily explained by her anxiety/depression, chronic posttraumatic stress disorder, and prolonged sleep disturbance.  I will sit down with the patient and go over the results of the objective neuropsychological assessment and go into greater depth with regard to treatment recommendations particularly around sleep hygiene and discussed the diagnostic considerations that I have made here.   Diagnosis:                                Depression with anxiety   Chronic post-traumatic stress disorder (PTSD)   Attention and concentration deficit     _____________________ Arley Phenix, Psy.D. Clinical Neuropsychologist

## 2021-03-10 ENCOUNTER — Other Ambulatory Visit: Payer: Self-pay | Admitting: Psychiatry

## 2021-03-15 ENCOUNTER — Encounter: Payer: Medicaid Other | Admitting: Certified Nurse Midwife
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
## 2021-05-11 ENCOUNTER — Encounter: Payer: Self-pay | Admitting: Psychiatry

## 2021-05-11 ENCOUNTER — Other Ambulatory Visit: Payer: Self-pay

## 2021-05-11 ENCOUNTER — Telehealth (INDEPENDENT_AMBULATORY_CARE_PROVIDER_SITE_OTHER): Payer: BLUE CROSS/BLUE SHIELD | Admitting: Psychiatry

## 2021-05-11 DIAGNOSIS — F330 Major depressive disorder, recurrent, mild: Secondary | ICD-10-CM

## 2021-05-11 DIAGNOSIS — F4310 Post-traumatic stress disorder, unspecified: Secondary | ICD-10-CM | POA: Diagnosis not present

## 2021-05-11 MED ORDER — BUPROPION HCL ER (XL) 150 MG PO TB24: 150.0000 mg | ORAL_TABLET | Freq: Every day | ORAL | 0 refills | Status: DC

## 2021-06-07 ENCOUNTER — Other Ambulatory Visit: Payer: Self-pay | Admitting: Psychiatry
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
## 2021-06-08 ENCOUNTER — Other Ambulatory Visit: Payer: Self-pay | Admitting: Psychiatry

## 2021-06-08 ENCOUNTER — Encounter: Payer: Medicaid Other | Admitting: Certified Nurse Midwife

## 2021-06-09 ENCOUNTER — Encounter: Payer: Self-pay | Admitting: Psychiatry

## 2021-06-09 ENCOUNTER — Telehealth (INDEPENDENT_AMBULATORY_CARE_PROVIDER_SITE_OTHER): Payer: BLUE CROSS/BLUE SHIELD | Admitting: Psychiatry

## 2021-06-09 ENCOUNTER — Other Ambulatory Visit: Payer: Self-pay

## 2021-06-09 DIAGNOSIS — F330 Major depressive disorder, recurrent, mild: Secondary | ICD-10-CM | POA: Diagnosis not present

## 2021-06-09 DIAGNOSIS — F4310 Post-traumatic stress disorder, unspecified: Secondary | ICD-10-CM

## 2021-06-09 MED ORDER — BUPROPION HCL ER (XL) 150 MG PO TB24: 150.0000 mg | ORAL_TABLET | Freq: Every day | ORAL | 0 refills | Status: DC

## 2021-06-09 NOTE — Patient Instructions (Signed)
Continue sertraline 150 mg daily  Continue bupropion 150 mg daily Next appointment-  3/30 at 8:30, in person  The next visit will be in person visit. Please arrive 15 mins before the scheduled time.   [facility]  Address: [address], [place], [place] [address]
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
## 2021-07-15 ENCOUNTER — Ambulatory Visit: Payer: Medicaid Other | Admitting: Psychiatry

## 2021-08-24 ENCOUNTER — Encounter: Payer: Medicaid Other | Admitting: Certified Nurse Midwife

## 2021-09-03 ENCOUNTER — Other Ambulatory Visit: Payer: Self-pay | Admitting: Psychiatry

## 2021-09-03 NOTE — Telephone Encounter (Signed)
Refill of bupropion is ordered per request. Please contact the patient for follow up appointment (for 30 mins). Thanks.

## 2021-09-06 ENCOUNTER — Other Ambulatory Visit: Payer: Self-pay | Admitting: Psychiatry

## 2021-09-06 NOTE — Telephone Encounter (Signed)
Ordered refill of sertraline per request. Please contact the patient to make follow up appointment for 30 mins. I will not be able to prescribe any more refills without evaluation. 

## 2021-10-06 ENCOUNTER — Other Ambulatory Visit: Payer: Self-pay | Admitting: Psychiatry

## 2021-10-14 ENCOUNTER — Telehealth: Payer: Self-pay | Admitting: Psychiatry

## 2021-10-16 ENCOUNTER — Other Ambulatory Visit: Payer: Self-pay | Admitting: Psychiatry

## 2021-11-08 ENCOUNTER — Encounter: Payer: Medicaid Other | Admitting: Certified Nurse Midwife
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
## 2021-12-08 NOTE — Telephone Encounter (Signed)
My Chart message sent

## 2021-12-09 ENCOUNTER — Encounter: Payer: Self-pay | Admitting: Psychiatry

## 2021-12-09 ENCOUNTER — Ambulatory Visit (INDEPENDENT_AMBULATORY_CARE_PROVIDER_SITE_OTHER): Payer: Medicaid Other | Admitting: Psychiatry

## 2021-12-09 VITALS — BP 134/94 | HR 85 | Temp 98.3°F | Wt 226.0 lb

## 2021-12-09 DIAGNOSIS — F4310 Post-traumatic stress disorder, unspecified: Secondary | ICD-10-CM

## 2021-12-09 DIAGNOSIS — R41840 Attention and concentration deficit: Secondary | ICD-10-CM

## 2021-12-09 DIAGNOSIS — F331 Major depressive disorder, recurrent, moderate: Secondary | ICD-10-CM | POA: Diagnosis not present

## 2021-12-09 MED ORDER — SERTRALINE HCL 100 MG PO TABS: ORAL_TABLET | ORAL | 0 refills | Status: DC

## 2021-12-09 NOTE — Patient Instructions (Signed)
Start sertraline 50 mg at night for 1 week, then 100 mg at night for 1 week, then 150 mg at night Hold bupropion  Next appointment-  10/19 at 9 AM

## 2022-02-01 ENCOUNTER — Encounter: Payer: Self-pay | Admitting: Certified Nurse Midwife

## 2022-02-01 ENCOUNTER — Encounter: Payer: Self-pay | Admitting: Obstetrics and Gynecology
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
## 2022-02-03 ENCOUNTER — Ambulatory Visit (INDEPENDENT_AMBULATORY_CARE_PROVIDER_SITE_OTHER): Payer: Medicaid Other | Admitting: Psychiatry

## 2022-02-03 ENCOUNTER — Encounter: Payer: Self-pay | Admitting: Psychiatry

## 2022-02-03 VITALS — BP 109/72 | HR 79 | Temp 98.4°F | Ht 67.0 in | Wt 234.0 lb

## 2022-02-03 DIAGNOSIS — F4310 Post-traumatic stress disorder, unspecified: Secondary | ICD-10-CM | POA: Diagnosis not present

## 2022-02-03 DIAGNOSIS — F331 Major depressive disorder, recurrent, moderate: Secondary | ICD-10-CM | POA: Diagnosis not present

## 2022-02-03 DIAGNOSIS — G4700 Insomnia, unspecified: Secondary | ICD-10-CM

## 2022-02-03 MED ORDER — SERTRALINE HCL 100 MG PO TABS: 200.0000 mg | ORAL_TABLET | Freq: Every day | ORAL | 1 refills | Status: DC

## 2022-02-03 NOTE — Patient Instructions (Signed)
Increase sertraline 200 mg daily  Referral for evaluation of sleep apnea Referred for neuropsychological evaluation/second opinion for ADHD Next appointment-  12/14 at 9 AM
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
## 2022-03-31 ENCOUNTER — Ambulatory Visit (INDEPENDENT_AMBULATORY_CARE_PROVIDER_SITE_OTHER): Payer: Medicaid Other | Admitting: Psychiatry

## 2022-03-31 ENCOUNTER — Encounter: Payer: Self-pay | Admitting: Psychiatry

## 2022-03-31 VITALS — BP 125/89 | HR 93 | Temp 98.7°F | Ht 67.0 in | Wt 224.8 lb

## 2022-03-31 DIAGNOSIS — F4310 Post-traumatic stress disorder, unspecified: Secondary | ICD-10-CM

## 2022-03-31 DIAGNOSIS — G4700 Insomnia, unspecified: Secondary | ICD-10-CM | POA: Diagnosis not present

## 2022-03-31 DIAGNOSIS — F330 Major depressive disorder, recurrent, mild: Secondary | ICD-10-CM

## 2022-03-31 MED ORDER — SERTRALINE HCL 100 MG PO TABS: 200.0000 mg | ORAL_TABLET | Freq: Every day | ORAL | 0 refills | Status: DC

## 2022-03-31 NOTE — Patient Instructions (Signed)
Continue sertraline 200 mg daily  Referral for evaluation of sleep apnea Next appointment-  2/29 at 1 PM
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
## 2022-06-16 ENCOUNTER — Encounter: Payer: Self-pay | Admitting: Psychiatry

## 2022-06-16 ENCOUNTER — Ambulatory Visit (INDEPENDENT_AMBULATORY_CARE_PROVIDER_SITE_OTHER): Payer: Medicaid Other | Admitting: Psychiatry

## 2022-06-16 VITALS — BP 125/81 | HR 93 | Temp 97.5°F | Ht 67.0 in | Wt 236.7 lb

## 2022-06-16 DIAGNOSIS — G4700 Insomnia, unspecified: Secondary | ICD-10-CM

## 2022-06-16 DIAGNOSIS — F3341 Major depressive disorder, recurrent, in partial remission: Secondary | ICD-10-CM

## 2022-06-16 DIAGNOSIS — F4310 Post-traumatic stress disorder, unspecified: Secondary | ICD-10-CM | POA: Diagnosis not present

## 2022-06-16 NOTE — Patient Instructions (Addendum)
Continue sertraline 200 mg daily  Referred for evaluation of sleep apnea- please contact the clinic Referred for neuropsychological evaluation/second opinion for ADHD - please contacted the clinic Next appointment-  4/24 at 9 AM

## 2022-07-07 ENCOUNTER — Ambulatory Visit: Payer: Medicaid Other | Admitting: Licensed Clinical Social Worker

## 2022-08-03 ENCOUNTER — Other Ambulatory Visit: Payer: Self-pay | Admitting: Psychiatry
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
## 2022-08-10 ENCOUNTER — Telehealth: Payer: Medicaid Other | Admitting: Psychiatry
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
## 2022-08-12 ENCOUNTER — Encounter: Payer: Self-pay | Admitting: Psychiatry

## 2022-08-12 ENCOUNTER — Telehealth (INDEPENDENT_AMBULATORY_CARE_PROVIDER_SITE_OTHER): Payer: Medicaid Other | Admitting: Psychiatry

## 2022-08-12 DIAGNOSIS — F331 Major depressive disorder, recurrent, moderate: Secondary | ICD-10-CM

## 2022-08-12 DIAGNOSIS — E559 Vitamin D deficiency, unspecified: Secondary | ICD-10-CM

## 2022-08-12 DIAGNOSIS — F4310 Post-traumatic stress disorder, unspecified: Secondary | ICD-10-CM | POA: Diagnosis not present

## 2022-08-12 DIAGNOSIS — G4700 Insomnia, unspecified: Secondary | ICD-10-CM | POA: Diagnosis not present

## 2022-08-12 DIAGNOSIS — R41840 Attention and concentration deficit: Secondary | ICD-10-CM

## 2022-08-12 MED ORDER — VENLAFAXINE HCL ER 150 MG PO CP24: 150.0000 mg | ORAL_CAPSULE | Freq: Every day | ORAL | 0 refills | Status: DC

## 2022-08-12 MED ORDER — VENLAFAXINE HCL ER 37.5 MG PO CP24: ORAL_CAPSULE | ORAL | 0 refills | Status: DC

## 2022-08-12 NOTE — Patient Instructions (Signed)
  Change medication as follows  - Decrease Sertraline by 50 mg every week until discontinuation. - AND start Venlafaxine to 37.5 mg daily for one week, followed by 75 mg daily for one week, then 150 mg daily. Continue this dose thereafter  Obtain UDS, vitamin D Referred for evaluation of sleep apnea Referred for neuropsychological evaluation/second opinion for ADHD Next appointment-  5/24 at 8 30

## 2022-08-13 ENCOUNTER — Encounter (INDEPENDENT_AMBULATORY_CARE_PROVIDER_SITE_OTHER): Payer: Medicaid Other

## 2022-08-13 DIAGNOSIS — F331 Major depressive disorder, recurrent, moderate: Secondary | ICD-10-CM | POA: Diagnosis not present

## 2022-08-15 MED ORDER — FLUOXETINE HCL 40 MG PO CAPS: 40.0000 mg | ORAL_CAPSULE | Freq: Every day | ORAL | 0 refills | Status: DC

## 2022-08-15 MED ORDER — FLUOXETINE HCL 20 MG PO CAPS: 20.0000 mg | ORAL_CAPSULE | Freq: Every day | ORAL | 0 refills | Status: DC

## 2022-08-15 NOTE — Telephone Encounter (Signed)
I've spent a total time of 13 minutes providing service to this patient-generated inquiry in the MyChart message

## 2022-08-15 NOTE — Addendum Note (Signed)
Addended by: Neysa Hotter on: 08/15/2022 05:54 PM   Modules accepted: Orders

## 2022-08-16 NOTE — Telephone Encounter (Signed)
Called pharmacy spoke to [facility] and she will cancel the Venlafaxine 37.5/150 mg
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
## 2022-09-06 ENCOUNTER — Other Ambulatory Visit: Payer: Self-pay | Admitting: Psychiatry

## 2022-09-09 ENCOUNTER — Telehealth (INDEPENDENT_AMBULATORY_CARE_PROVIDER_SITE_OTHER): Payer: Medicaid Other | Admitting: Psychiatry

## 2022-09-09 ENCOUNTER — Encounter: Payer: Self-pay | Admitting: Psychiatry

## 2022-09-09 DIAGNOSIS — G4700 Insomnia, unspecified: Secondary | ICD-10-CM

## 2022-09-09 DIAGNOSIS — F3341 Major depressive disorder, recurrent, in partial remission: Secondary | ICD-10-CM

## 2022-09-09 DIAGNOSIS — R41840 Attention and concentration deficit: Secondary | ICD-10-CM

## 2022-09-09 DIAGNOSIS — E559 Vitamin D deficiency, unspecified: Secondary | ICD-10-CM

## 2022-09-09 DIAGNOSIS — F4310 Post-traumatic stress disorder, unspecified: Secondary | ICD-10-CM | POA: Diagnosis not present

## 2022-09-26 ENCOUNTER — Other Ambulatory Visit: Payer: Self-pay | Admitting: Psychiatry

## 2022-09-26 MED ORDER — FLUOXETINE HCL 40 MG PO CAPS: 40.0000 mg | ORAL_CAPSULE | Freq: Every day | ORAL | 1 refills | Status: DC

## 2022-09-27 ENCOUNTER — Telehealth: Payer: Self-pay

## 2022-09-27 NOTE — Telephone Encounter (Signed)
approved - prior aut ]h can be overridden at the point of sales using over ride code.

## 2022-09-27 NOTE — Telephone Encounter (Signed)
PA has been approved for the Fluoxetine patient made aware

## 2022-09-27 NOTE — Telephone Encounter (Signed)
Pharmacy notified by fax.

## 2022-09-27 NOTE — Telephone Encounter (Signed)
received fax requesting a prior auth for the fluoxetine hcl 40mg 

## 2022-09-27 NOTE — Telephone Encounter (Signed)
PA approved for Fluoxetine HCI 40 mg capsules Coverage 09/27/22------04/18/2023

## 2022-09-27 NOTE — Telephone Encounter (Signed)
went online to [email or web address] and submitted the prior auth for the fluoxetine - pending
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
## 2022-10-26 ENCOUNTER — Other Ambulatory Visit: Payer: Self-pay | Admitting: Psychiatry

## 2022-10-28 ENCOUNTER — Telehealth (INDEPENDENT_AMBULATORY_CARE_PROVIDER_SITE_OTHER): Payer: Medicaid Other | Admitting: Psychiatry

## 2022-10-28 ENCOUNTER — Encounter: Payer: Self-pay | Admitting: Psychiatry

## 2022-10-28 DIAGNOSIS — F3341 Major depressive disorder, recurrent, in partial remission: Secondary | ICD-10-CM

## 2022-10-28 DIAGNOSIS — G4700 Insomnia, unspecified: Secondary | ICD-10-CM

## 2022-10-28 DIAGNOSIS — F4310 Post-traumatic stress disorder, unspecified: Secondary | ICD-10-CM | POA: Diagnosis not present

## 2022-10-28 MED ORDER — ESCITALOPRAM OXALATE 10 MG PO TABS: ORAL_TABLET | ORAL | 1 refills | Status: DC

## 2022-11-20 ENCOUNTER — Other Ambulatory Visit: Payer: Self-pay | Admitting: Psychiatry
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
## 2022-12-21 ENCOUNTER — Encounter: Payer: Self-pay | Admitting: Psychiatry

## 2022-12-21 ENCOUNTER — Telehealth: Payer: Medicaid Other | Admitting: Psychiatry

## 2022-12-21 DIAGNOSIS — F3341 Major depressive disorder, recurrent, in partial remission: Secondary | ICD-10-CM

## 2022-12-21 DIAGNOSIS — F4310 Post-traumatic stress disorder, unspecified: Secondary | ICD-10-CM | POA: Diagnosis not present

## 2022-12-21 MED ORDER — ESCITALOPRAM OXALATE 20 MG PO TABS: 20.0000 mg | ORAL_TABLET | Freq: Every day | ORAL | 0 refills | Status: DC

## 2022-12-21 NOTE — Patient Instructions (Signed)
Increase lexapro 20 mg daily Obtain vitamin D  Referred for evaluation of sleep apnea Next appointment-  10/30 at 8 40

## 2023-01-30 NOTE — Telephone Encounter (Signed)
Could you contact her to schedule sooner follow up, 30 mins? Video is fine. Thanks.
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
## 2023-02-02 ENCOUNTER — Ambulatory Visit (INDEPENDENT_AMBULATORY_CARE_PROVIDER_SITE_OTHER): Payer: Medicaid Other | Admitting: Psychiatry

## 2023-02-02 ENCOUNTER — Encounter: Payer: Self-pay | Admitting: Psychiatry

## 2023-02-02 VITALS — BP 120/84 | HR 89 | Temp 98.2°F | Ht 67.0 in | Wt 239.8 lb

## 2023-02-02 DIAGNOSIS — F4310 Post-traumatic stress disorder, unspecified: Secondary | ICD-10-CM

## 2023-02-02 DIAGNOSIS — R41840 Attention and concentration deficit: Secondary | ICD-10-CM | POA: Diagnosis not present

## 2023-02-02 DIAGNOSIS — F3341 Major depressive disorder, recurrent, in partial remission: Secondary | ICD-10-CM | POA: Diagnosis not present

## 2023-02-02 MED ORDER — VORTIOXETINE HBR 10 MG PO TABS: 10.0000 mg | ORAL_TABLET | Freq: Every day | ORAL | 1 refills | Status: AC

## 2023-02-02 MED ORDER — ATOMOXETINE HCL 40 MG PO CAPS: 40.0000 mg | ORAL_CAPSULE | Freq: Every day | ORAL | 1 refills | Status: DC

## 2023-02-02 NOTE — Patient Instructions (Signed)
Plan Start atomoxetine 40 mg daily  After a week,   Decrease lexapro 10 mg daily for one week, then discontinue  Start Trintellix 10 mg daily Next appointment-  12/16 at 1 pm

## 2023-02-03 ENCOUNTER — Telehealth: Payer: Self-pay

## 2023-02-03 NOTE — Telephone Encounter (Signed)
PA initiated for Trintellix 10 mg tablets via CoverMyMeds   PA approved  PA case ID [id number] Coverage 02/03/23----02/03/24  Patient made aware of the approval

## 2023-02-15 ENCOUNTER — Telehealth: Payer: Medicaid Other | Admitting: Psychiatry

## 2023-02-20 ENCOUNTER — Other Ambulatory Visit: Payer: Self-pay | Admitting: Psychiatry

## 2023-02-27 ENCOUNTER — Other Ambulatory Visit: Payer: Self-pay | Admitting: Psychiatry
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
## 2023-04-03 ENCOUNTER — Ambulatory Visit: Payer: Medicaid Other | Admitting: Psychiatry

## 2023-05-02 ENCOUNTER — Telehealth: Payer: Self-pay | Admitting: Psychiatry

## 2023-05-02 NOTE — Telephone Encounter (Signed)
 Reached out to PT on 1/14 to make a follow up and LVM for her to call back to schedule.

## 2023-05-31 ENCOUNTER — Ambulatory Visit: Payer: Medicaid Other

## 2023-05-31 DIAGNOSIS — D123 Benign neoplasm of transverse colon: Secondary | ICD-10-CM | POA: Diagnosis not present

## 2023-05-31 DIAGNOSIS — D124 Benign neoplasm of descending colon: Secondary | ICD-10-CM | POA: Diagnosis not present

## 2023-05-31 DIAGNOSIS — D120 Benign neoplasm of cecum: Secondary | ICD-10-CM | POA: Diagnosis not present

## 2023-05-31 DIAGNOSIS — D128 Benign neoplasm of rectum: Secondary | ICD-10-CM | POA: Diagnosis not present

## 2023-06-07 ENCOUNTER — Other Ambulatory Visit: Payer: Self-pay | Admitting: Medical Genetics

## 2023-06-08 ENCOUNTER — Ambulatory Visit: Attending: Internal Medicine

## 2023-06-08 DIAGNOSIS — R0683 Snoring: Secondary | ICD-10-CM | POA: Diagnosis present

## 2023-06-08 DIAGNOSIS — Z6835 Body mass index (BMI) 35.0-35.9, adult: Secondary | ICD-10-CM | POA: Insufficient documentation

## 2023-06-08 DIAGNOSIS — E669 Obesity, unspecified: Secondary | ICD-10-CM | POA: Diagnosis present

## 2023-06-08 DIAGNOSIS — G479 Sleep disorder, unspecified: Secondary | ICD-10-CM | POA: Insufficient documentation

## 2024-01-31 ENCOUNTER — Other Ambulatory Visit: Payer: Self-pay | Admitting: Genetic Counselor

## 2024-01-31 DIAGNOSIS — Z006 Encounter for examination for normal comparison and control in clinical research program: Secondary | ICD-10-CM

## 2024-04-21 ENCOUNTER — Encounter (HOSPITAL_COMMUNITY): Payer: Self-pay

## 2024-04-21 ENCOUNTER — Inpatient Hospital Stay (HOSPITAL_COMMUNITY)

## 2024-04-21 ENCOUNTER — Inpatient Hospital Stay (HOSPITAL_COMMUNITY)
Admission: EM | Admit: 2024-04-21 | Discharge: 2024-04-21 | Disposition: A | Discharge status: Home / Self Care | Attending: Obstetrics and Gynecology | Admitting: Obstetrics and Gynecology

## 2024-04-21 ENCOUNTER — Other Ambulatory Visit: Payer: Self-pay

## 2024-04-21 DIAGNOSIS — Z113 Encounter for screening for infections with a predominantly sexual mode of transmission: Secondary | ICD-10-CM | POA: Diagnosis present

## 2024-04-21 DIAGNOSIS — O23591 Infection of other part of genital tract in pregnancy, first trimester: Secondary | ICD-10-CM | POA: Diagnosis not present

## 2024-04-21 DIAGNOSIS — B9689 Other specified bacterial agents as the cause of diseases classified elsewhere: Secondary | ICD-10-CM | POA: Insufficient documentation

## 2024-04-21 DIAGNOSIS — R7989 Other specified abnormal findings of blood chemistry: Secondary | ICD-10-CM | POA: Insufficient documentation

## 2024-04-21 DIAGNOSIS — Z3201 Encounter for pregnancy test, result positive: Secondary | ICD-10-CM | POA: Diagnosis present

## 2024-04-21 DIAGNOSIS — O3680X0 Pregnancy with inconclusive fetal viability, not applicable or unspecified: Secondary | ICD-10-CM | POA: Insufficient documentation

## 2024-04-21 DIAGNOSIS — Z3A01 Less than 8 weeks gestation of pregnancy: Secondary | ICD-10-CM | POA: Diagnosis not present

## 2024-04-21 DIAGNOSIS — O26891 Other specified pregnancy related conditions, first trimester: Secondary | ICD-10-CM

## 2024-04-21 DIAGNOSIS — O209 Hemorrhage in early pregnancy, unspecified: Secondary | ICD-10-CM | POA: Diagnosis present

## 2024-04-21 LAB — URINALYSIS, ROUTINE W REFLEX MICROSCOPIC
Bacteria, UA: NONE SEEN
Bilirubin Urine: NEGATIVE
Glucose, UA: NEGATIVE mg/dL
Ketones, ur: NEGATIVE mg/dL
Leukocytes,Ua: NEGATIVE
Nitrite: NEGATIVE
Protein, ur: NEGATIVE mg/dL
Specific Gravity, Urine: 1.01 (ref 1.005–1.030)
pH: 7 (ref 5.0–8.0)

## 2024-04-21 LAB — CBC
HCT: 37.2 % (ref 36.0–46.0)
Hemoglobin: 12.5 g/dL (ref 12.0–15.0)
MCH: 32.4 pg (ref 26.0–34.0)
MCHC: 33.6 g/dL (ref 30.0–36.0)
MCV: 96.4 fL (ref 80.0–100.0)
Platelets: 387 K/uL (ref 150–400)
RBC: 3.86 MIL/uL — ABNORMAL LOW (ref 3.87–5.11)
RDW: 12.5 % (ref 11.5–15.5)
WBC: 13.3 K/uL — ABNORMAL HIGH (ref 4.0–10.5)
nRBC: 0 % (ref 0.0–0.2)

## 2024-04-21 LAB — WET PREP, GENITAL
Sperm: NONE SEEN
Trich, Wet Prep: NONE SEEN
WBC, Wet Prep HPF POC: >=10 — AB
Yeast Wet Prep HPF POC: NONE SEEN

## 2024-04-21 LAB — ABO/RH: ABO/RH(D): O POS

## 2024-04-21 LAB — HCG, QUANTITATIVE, PREGNANCY: hCG, Beta Chain, Quant, S: 35 m[IU]/mL — ABNORMAL HIGH

## 2024-04-21 MED ORDER — METRONIDAZOLE 500 MG PO TABS: 500.0000 mg | ORAL_TABLET | Freq: Two times a day (BID) | ORAL | 0 refills | Status: AC

## 2024-04-21 NOTE — ED Triage Notes (Signed)
 Pt arrived POV from home c/o light vaginal bleeding and some abdominal cramping that started this morning. Pt did take an at home pregnancy test that was positive. Pt thinks she is about [redacted] weeks pregnant but she has not confirmed with a doctor.

## 2024-04-21 NOTE — ED Notes (Signed)
 Positive urine preg. RN aware. Results not crossing over

## 2024-04-21 NOTE — MAU Note (Signed)
 Isabella Weaver is a 34 y.o. at Unknown here in MAU reporting: +HPT on 04/10/24. + urine test in ED today.  Reports pink spotting this am when she wiped and it has increased and has turned more red today. Small clots present. Points to light bleeding picture in triage. Minimal lower abdominal cramping. Patient denies any unusual vaginal discharge, vaginal odor, itching or pain. Recent sexual intercourse 2 days ago.     LMP: 03/13/24 Onset of complaint: today Pain score: 1 Vitals:   04/21/24 1035  BP: 122/83  Pulse: 73  Resp: 14  Temp: 98.8 F (37.1 C)  SpO2: 100%     FHT:n/a Lab orders placed from triage:  [facility]

## 2024-04-21 NOTE — MAU Provider Note (Signed)
 Chief Complaint:  Vaginal Bleeding   HPI   None     Solara Goodchild is a 34 y.o. G1P1001 at Unknown who presents to maternity admissions reporting vaginal spotting this morning, along with lower abdominal cramping. Had a positive urine pregnancy test on December 24th. Denies any other symptoms at this time.  Past Medical History:  Diagnosis Date   Anxiety    Depression    Painful menstrual periods    Vitamin D  deficiency    OB History  Gravida Para Term Preterm AB Living  1 1 1  0 0 1  SAB IAB Ectopic Multiple Live Births  0 0 0 0 1    # Outcome Date GA Lbr Len/2nd Weight Sex Type Anes PTL Lv  1 Term 01/23/19 [redacted]w[redacted]d / 00:01 3070 g F Vag-Spont None  LIV   History reviewed. No pertinent surgical history. Family History  Problem Relation Age of Onset   Diabetes Maternal Aunt    Cancer Mother    Asthma Father    Anxiety disorder Sister    Depression Sister    Social History[1] Allergies[2] No medications prior to admission.    I have reviewed patient's Past Medical Hx, Surgical Hx, Family Hx, Social Hx, medications and allergies.   ROS  Pertinent items noted in HPI and remainder of comprehensive ROS otherwise negative.   PHYSICAL EXAM  Patient Vitals for the past 24 hrs:  BP Temp Temp src Pulse Resp SpO2  04/21/24 1219 120/80 98.8 F (37.1 C) Oral 70 16 --  04/21/24 1035 122/83 98.8 F (37.1 C) -- 73 14 100 %    Constitutional: Well-developed, well-nourished female in no acute distress.  Cardiovascular: normal rate & rhythm, warm and well-perfused Respiratory: normal effort, no problems with respiration noted GI: Abd soft, non-tender, non-distended MS: Extremities nontender, no edema, normal ROM Neurologic: Alert and oriented x 4.        Labs: Results for orders placed or performed during the hospital encounter of 04/21/24 (from the past 24 hours)  Wet prep, genital     Status: Abnormal   Collection Time: 04/21/24 12:20 PM   Specimen: Vaginal  Result Value  Ref Range   Yeast Wet Prep HPF POC NONE SEEN NONE SEEN   Trich, Wet Prep NONE SEEN NONE SEEN   Clue Cells Wet Prep HPF POC PRESENT (A) NONE SEEN   WBC, Wet Prep HPF POC >=10 (A) <10   Sperm NONE SEEN   Urinalysis, Routine w reflex microscopic -Urine, Clean Catch     Status: Abnormal   Collection Time: 04/21/24 12:21 PM  Result Value Ref Range   Color, Urine STRAW (A) YELLOW   APPearance CLEAR CLEAR   Specific Gravity, Urine 1.010 1.005 - 1.030   pH 7.0 5.0 - 8.0   Glucose, UA NEGATIVE NEGATIVE mg/dL   Hgb urine dipstick MODERATE (A) NEGATIVE   Bilirubin Urine NEGATIVE NEGATIVE   Ketones, ur NEGATIVE NEGATIVE mg/dL   Protein, ur NEGATIVE NEGATIVE mg/dL   Nitrite NEGATIVE NEGATIVE   Leukocytes,Ua NEGATIVE NEGATIVE   RBC / HPF 0-5 0 - 5 RBC/hpf   WBC, UA 0-5 0 - 5 WBC/hpf   Bacteria, UA NONE SEEN NONE SEEN   Squamous Epithelial / HPF 0-5 0 - 5 /HPF  ABO/Rh     Status: None   Collection Time: 04/21/24 12:36 PM  Result Value Ref Range   ABO/RH(D) O POS    No rh immune globuloin      NOT A RH IMMUNE  GLOBULIN CANDIDATE, PT RH POSITIVE Performed at [facility], [address], [place], [place] [address]   CBC     Status: Abnormal   Collection Time: 04/21/24 12:37 PM  Result Value Ref Range   WBC 13.3 (H) 4.0 - 10.5 K/uL   RBC 3.86 (L) 3.87 - 5.11 MIL/uL   Hemoglobin 12.5 12.0 - 15.0 g/dL   HCT 62.7 63.9 - 53.9 %   MCV 96.4 80.0 - 100.0 fL   MCH 32.4 26.0 - 34.0 pg   MCHC 33.6 30.0 - 36.0 g/dL   RDW 87.4 88.4 - 84.4 %   Platelets 387 150 - 400 K/uL   nRBC 0.0 0.0 - 0.2 %  hCG, quantitative, pregnancy     Status: Abnormal   Collection Time: 04/21/24 12:37 PM  Result Value Ref Range   hCG, Beta Chain, Quant, S 35 (H) <5 mIU/mL    Imaging:  US  OB LESS THAN 14 WEEKS WITH OB TRANSVAGINAL Result Date: 04/21/2024 CLINICAL DATA:  [id number] Vaginal bleeding before [redacted] weeks gestation [id number] [redacted] weeks gestation of pregnancy [id number] EXAM: OBSTETRIC <14 WK US  AND TRANSVAGINAL OB  US  TECHNIQUE: Both transabdominal and transvaginal ultrasound examinations were performed for complete evaluation of the gestation as well as the maternal uterus, adnexal regions, and pelvic cul-de-sac. Transvaginal technique was performed to assess early pregnancy. COMPARISON:  None Available. FINDINGS: Intrauterine gestational sac: None visualized LMP: 03/13/2024.  Gestational age by LMP would be 5 weeks 4 days. Right ovary: Limited provided views of the RIGHT ovary demonstrates a 2.1 cm cystic structure likely reflecting a corpus luteal cyst. RIGHT ovary measures 3.0 x 3.1 x 2.4 cm for an estimated volume of 11.1 ML. Left ovary: Measures 3.8 x 2.6 x 3.0 cm for an estimated volume of 15.9 ML. Other :None Free fluid:  None IMPRESSION: No intrauterine gestational sac, yolk sac, or fetal pole identified. In the setting of positive pregnancy test and no definite intrauterine pregnancy, this reflects a pregnancy of unknown location. Differential considerations include early normal IUP, abnormal IUP, or nonvisualized ectopic pregnancy. Differentiation is achieved with serial beta HCG supplemented by repeat sonography as clinically warranted. Electronically Signed   By: Corean Salter M.D.   On: 04/21/2024 13:29    MDM & MAU COURSE  MDM: Moderate  MAU Course: Orders Placed This Encounter  Procedures   Wet prep, genital   US  OB LESS THAN 14 WEEKS WITH OB TRANSVAGINAL   Urinalysis, Routine w reflex microscopic -Urine, Clean Catch   CBC   hCG, quantitative, pregnancy   POC Urine Pregnancy, ED (not at [facility] or [facility])   ABO/Rh   Discharge patient   Meds ordered this encounter  Medications   metroNIDAZOLE  (FLAGYL ) 500 MG tablet    Sig: Take 1 tablet (500 mg total) by mouth 2 (two) times daily.    Dispense:  14 tablet    Refill:  0   VSS. Exam unremarkable. hCG 35, ultrasound consistent with pregnancy of unknown location at this time. Wet prep showing clue cells, consistent with bacterial vaginosis.  Discussed results with patient. Plan for repeat hCG in 48 hours. Will send message to [facility] for lab appointment. All questions answered prior to discharge.  ASSESSMENT   1. Elevated serum hCG   2. Pregnancy of unknown anatomic location   3. Bacterial vaginosis     PLAN  Discharge home in stable condition with return precautions.  Follow up with [facility] for repeat hCG.  Follow-up Information     [facility]. Go on 04/23/2024.   Why: for repeat bloodwork Contact information: [place]                 Allergies as of 04/21/2024       Reactions   Fiber Other (See Comments)   Eggplant   Latex         Medication List     STOP taking these medications    atomoxetine  40 MG capsule Commonly known as: STRATTERA    tranexamic acid 650 MG Tabs tablet Commonly known as: LYSTEDA       TAKE these medications    metroNIDAZOLE  500 MG tablet Commonly known as: FLAGYL  Take 1 tablet (500 mg total) by mouth 2 (two) times daily.   Multi-Vitamin tablet Take 1 tablet by mouth daily.        Charlie Courts, MD  Family Medicine - Obstetrics Fellow        [1]  Social History Tobacco Use   Smoking status: Never   Smokeless tobacco: Never  Vaping Use   Vaping status: Never Used  Substance Use Topics   Alcohol use: Not Currently    Comment: occas   Drug use: No  [2]  Allergies Allergen Reactions   Fiber Other (See Comments)    Eggplant   Latex

## 2024-04-21 NOTE — Discharge Instructions (Signed)
 You came into the hospital because you are having vaginal spotting and lower abdominal cramping.  We did labs and an ultrasound.  Your labs showed a pregnancy hormone level of 35.  Your ultrasound did not show a pregnancy inside or outside of the uterus-we called this a pregnancy of unknown location.  At this time, I would recommend repeating your pregnancy hormone level in 48 hours.  I have sent a message to [facility] to schedule you for a lab appointment on Tuesday, January 6 in the afternoon.  They will give you a call tomorrow morning with the appointment time.  I will include the address in your paperwork.  Your labs also showed bacterial vaginosis.  I have sent a medication for metronidazole  to your pharmacy.  You will take 1 pill twice a day for 7 days.

## 2024-04-21 NOTE — ED Provider Triage Note (Signed)
 Emergency Medicine Provider Triage Evaluation Note  Isabella Weaver , a 34 y.o. female  was evaluated in triage.  Pt complains of vaginal bleeding that started this morning.  Reports she had a positive pregnancy test at [place] which was about 10 days ago.  Denies any fevers.  Some lower abdominal cramping.  Review of Systems  Positive: As above Negative: As above  Physical Exam  BP 122/83   Pulse 73   Temp 98.8 F (37.1 C)   Resp 14   LMP 03/13/2024 (Exact Date)   SpO2 100%  Gen:   Awake, no distress   Resp:  Normal effort  MSK:   Moves extremities without difficulty    Medical Decision Making  Medically screening exam initiated at 11:19 AM.  Appropriate orders placed.  Michaelene Dutan was informed that the remainder of the evaluation will be completed by another provider, this initial triage assessment does not replace that evaluation, and the importance of remaining in the ED until their evaluation is complete.  I called MAU who is accepting patient for further workup   Veta Palma, DEVONNA 04/21/24 1119

## 2024-04-22 LAB — GC/CHLAMYDIA PROBE AMP (~~LOC~~) NOT AT ARMC
Chlamydia: NEGATIVE
Comment: NEGATIVE
Comment: NORMAL
Neisseria Gonorrhea: NEGATIVE

## 2024-04-22 LAB — POCT PREGNANCY, URINE: Preg Test, Ur: POSITIVE — AB

## 2024-04-23 ENCOUNTER — Other Ambulatory Visit

## 2024-04-23 ENCOUNTER — Other Ambulatory Visit: Payer: Self-pay

## 2024-04-23 DIAGNOSIS — R7989 Other specified abnormal findings of blood chemistry: Secondary | ICD-10-CM

## 2024-04-24 LAB — BETA HCG QUANT (REF LAB): hCG Quant: 10 m[IU]/mL

## 2024-04-29 ENCOUNTER — Ambulatory Visit: Payer: Self-pay
